# Patient Record
Sex: Male | Born: 1960 | Race: White | Hispanic: No | Marital: Single | Smoking: Current every day smoker
Health system: Southern US, Community
[De-identification: ages and names within clinical notes are randomized; demographics above are authoritative.]

## PROBLEM LIST (undated history)

## (undated) DIAGNOSIS — E7800 Pure hypercholesterolemia, unspecified: Secondary | ICD-10-CM

## (undated) DIAGNOSIS — J449 Chronic obstructive pulmonary disease, unspecified: Secondary | ICD-10-CM

## (undated) DIAGNOSIS — F200 Paranoid schizophrenia: Secondary | ICD-10-CM

## (undated) HISTORY — PX: HAND SURGERY: SHX662

## (undated) HISTORY — PX: DG BARIUM SWALLOW (ARMC HX): HXRAD1448

---

## 2011-03-31 ENCOUNTER — Emergency Department: Payer: Self-pay | Admitting: Emergency Medicine

## 2011-07-17 ENCOUNTER — Ambulatory Visit: Payer: Self-pay | Admitting: Ophthalmology

## 2011-08-21 ENCOUNTER — Ambulatory Visit: Payer: Self-pay | Admitting: Ophthalmology

## 2011-09-22 ENCOUNTER — Inpatient Hospital Stay: Payer: Self-pay | Admitting: Psychiatry

## 2011-09-26 ENCOUNTER — Emergency Department: Payer: Self-pay | Admitting: Emergency Medicine

## 2013-07-04 ENCOUNTER — Encounter (HOSPITAL_COMMUNITY): Payer: Self-pay | Admitting: Emergency Medicine

## 2013-07-04 ENCOUNTER — Emergency Department (HOSPITAL_COMMUNITY)
Admission: EM | Admit: 2013-07-04 | Discharge: 2013-07-04 | Disposition: A | Discharge status: Home / Self Care | Payer: Medicare Other | Attending: Emergency Medicine | Admitting: Emergency Medicine

## 2013-07-04 DIAGNOSIS — F200 Paranoid schizophrenia: Secondary | ICD-10-CM | POA: Insufficient documentation

## 2013-07-04 DIAGNOSIS — J4489 Other specified chronic obstructive pulmonary disease: Secondary | ICD-10-CM | POA: Insufficient documentation

## 2013-07-04 DIAGNOSIS — Z880 Allergy status to penicillin: Secondary | ICD-10-CM | POA: Insufficient documentation

## 2013-07-04 DIAGNOSIS — F17200 Nicotine dependence, unspecified, uncomplicated: Secondary | ICD-10-CM | POA: Insufficient documentation

## 2013-07-04 DIAGNOSIS — J449 Chronic obstructive pulmonary disease, unspecified: Secondary | ICD-10-CM | POA: Insufficient documentation

## 2013-07-04 HISTORY — DX: Chronic obstructive pulmonary disease, unspecified: J44.9

## 2013-07-04 HISTORY — DX: Paranoid schizophrenia: F20.0

## 2013-07-04 LAB — CBC WITH DIFFERENTIAL/PLATELET
Eosinophils Absolute: 0.6 10*3/uL (ref 0.0–0.7)
Hemoglobin: 12.8 g/dL — ABNORMAL LOW (ref 13.0–17.0)
Lymphocytes Relative: 27 % (ref 12–46)
Lymphs Abs: 2.8 10*3/uL (ref 0.7–4.0)
MCH: 29.2 pg (ref 26.0–34.0)
MCHC: 34 g/dL (ref 30.0–36.0)
Monocytes Relative: 6 % (ref 3–12)
Neutro Abs: 6.2 10*3/uL (ref 1.7–7.7)
Neutrophils Relative %: 61 % (ref 43–77)
RBC: 4.39 MIL/uL (ref 4.22–5.81)
WBC: 10.3 10*3/uL (ref 4.0–10.5)

## 2013-07-04 LAB — BASIC METABOLIC PANEL
GFR calc Af Amer: >90 mL/min (ref >90)
GFR calc non Af Amer: >90 mL/min (ref >90)
Glucose, Bld: 121 mg/dL — ABNORMAL HIGH (ref 70–99)
Potassium: 3.5 mEq/L (ref 3.5–5.1)
Sodium: 138 mEq/L (ref 135–145)

## 2013-07-04 LAB — URINALYSIS, ROUTINE W REFLEX MICROSCOPIC
Bilirubin Urine: NEGATIVE
Glucose, UA: NEGATIVE mg/dL
Hgb urine dipstick: NEGATIVE
Protein, ur: NEGATIVE mg/dL
Urobilinogen, UA: 0.2 mg/dL (ref 0.0–1.0)

## 2013-07-04 LAB — RAPID URINE DRUG SCREEN, HOSP PERFORMED
Amphetamines: NOT DETECTED
Barbiturates: NOT DETECTED
Benzodiazepines: NOT DETECTED
Cocaine: NOT DETECTED
Opiates: NOT DETECTED
Tetrahydrocannabinol: NOT DETECTED

## 2013-07-04 LAB — ETHANOL: Alcohol, Ethyl (B): <11 mg/dL (ref 0–11)

## 2013-07-04 NOTE — ED Notes (Signed)
Gave pt ice chips, telepsy set up in room, appt time 1930

## 2013-07-04 NOTE — ED Notes (Signed)
telepsy complete, they will call MD for a plan. Pt does not meet criteria for mental health in patient. [facility] is the facility the pt lives. They called for report. [phone].

## 2013-07-04 NOTE — ED Notes (Signed)
Patient arrives via EMS from [facility] care with c/o shaking. Patient states he has been abusing benadryl to get "a buzz". Patient denies SI/HI. H/o Paranoid schizophrenia.

## 2013-07-04 NOTE — BH Assessment (Signed)
Tele Assessment Note   Roberto Davis is an 52 y.o. male, single, Caucasian who presents to [facility] complaining of tremors and dissatisfaction with his current living situation. Pt has a history of schizophrenia and states he has been a resident of [facility] assisted living in [place] for approximately two years. He states that he is "terribly unhappy" living there. He states the staff are unprofessional and they do not wash their hands before serving food. He states he feels "trapped" and very lonely. He has no friends of family member who visit him and the only visitor he identifies is his attorney. He says that his unhappiness led him to abuse Benadryl and he was using 30 mg per day before he stopped on September, 2014. Pt is currently receiving outpatient medication management with Dr. Dolores Frame but is not receiving any counseling. He states he last saw Dr. Omelia Blackwater in July, has had no recent medication changes and his next appointment is in November.  Pt denies suicidal ideation or a history of suicidal gestures. He denies homicidal ideation or a history of violence. He denies any current auditory or visual hallucinations. 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 He denies any legal problems.  Pt is dressed in a hospital gown and is alert and oriented x4. He speech is somewhat difficult to understand due to not enunciating clearly. His motor behavior is restless. Mood is despondent and affect is congruent with mood. Thought process is coherent, goal directed and there is no evidence of delusional content.  Axis I: Schizophrenia by history Axis II: Deferred Axis III:  Past Medical History  Diagnosis Date  . Paranoid schizophrenia   . COPD (chronic obstructive pulmonary disease)    Axis IV: housing problems, problems related to social environment and problems with primary support group Axis V:  GAF=50  Past Medical History:  Past Medical History  Diagnosis Date  . Paranoid schizophrenia   . COPD (chronic obstructive pulmonary disease)     Past Surgical History  Procedure Laterality Date  . Hand surgery      Family History: No family history on file.  Social History:  reports that he has been smoking Cigarettes.  He has been smoking about 0.00 packs per day. He does not have any smokeless tobacco history on file. He reports that he uses illicit drugs (Marijuana). He reports that he does not drink alcohol.  Additional Social History:  Alcohol / Drug Use Pain Medications: Denies Prescriptions: Denies Over the Counter: Hx of abusing Benedryl History of alcohol / drug use?: Yes Longest period of sobriety (when/how long): NA Substance #1 Name of Substance 1: Benedryl 1 - Age of First Use: 52 1 - Amount (size/oz): 30 mg 1 - Frequency: daily 1 - Duration: Several months 1 - Last Use / Amount: 06/22/13  CIWA: CIWA-Ar BP: 138/91 mmHg Pulse Rate: 102 COWS:    Allergies:  Allergies  Allergen Reactions  . Haldol [Haloperidol Lactate] Other (See Comments)    Shaking uncontrollable   . Penicillins Other (See Comments)    Reaction unknown-childhood allergy    Home Medications:  (Not in a hospital admission)  OB/GYN Status:  No LMP for male patient.  General Assessment Data Location of Assessment: [facility] Is this a Tele or Face-to-Face Assessment?: Tele Assessment Is this an Initial Assessment or a Re-assessment for this encounter?: Initial Assessment Living Arrangements: Other (Comment) [facility]) Can  pt return to current living arrangement?: Yes Admission Status: Voluntary Is patient capable of signing voluntary admission?: Yes Transfer from: Nsg Home Referral Source: Self/Family/Friend     [facility] Crisis Care Plan Living Arrangements: Other (Comment) [facility]) Name of Psychiatrist: Dolores Frame, MD Name of Therapist:  None  Education Status Is patient currently in school?: No Current Grade: NA Highest grade of school patient has completed: NA Name of school: NA Contact person: NA  Risk to self Suicidal Ideation: No Suicidal Intent: No Is patient at risk for suicide?: No Suicidal Plan?: No-Not Currently/Within Last 6 Months Access to Means: No What has been your use of drugs/alcohol within the last 12 months?: Pt reports a history of abusing Benedryl and THC Previous Attempts/Gestures: No How many times?: 0 Other Self Harm Risks: None Triggers for Past Attempts: None known Intentional Self Injurious Behavior: None Family Suicide History: No Recent stressful life event(s): Other (Comment) (Dissatisfaction with group home staff) Persecutory voices/beliefs?: No Depression: Yes Depression Symptoms: Despondent Substance abuse history and/or treatment for substance abuse?: Yes Suicide prevention information given to non-admitted patients: Not applicable  Risk to Others Homicidal Ideation: No Thoughts of Harm to Others: No Current Homicidal Intent: No Current Homicidal Plan: No Access to Homicidal Means: No Identified Victim: None History of harm to others?: No Assessment of Violence: None Noted Violent Behavior Description: "I'm not a violent person" Does patient have access to weapons?: No Criminal Charges Pending?: No Does patient have a court date: No  Psychosis Hallucinations: None noted Delusions: None noted  Mental Status Report Appear/Hygiene: Other (Comment) (Dressed in hospital gown) Eye Contact: Good Motor Activity: Restlessness Speech: Logical/coherent Level of Consciousness: Alert Mood: Depressed Affect: Appropriate to circumstance Anxiety Level: None Thought Processes: Coherent;Relevant Judgement: Unimpaired Orientation: Person;Place;Time;Situation Obsessive Compulsive Thoughts/Behaviors: None  Cognitive Functioning Concentration: Normal Memory: Recent  Intact;Remote Intact IQ: Average Insight: Fair Impulse Control: Good Appetite: Good Weight Loss: 0 (unknown amount) Weight Gain: 0 Sleep: No Change Total Hours of Sleep: 8 Vegetative Symptoms: None  ADLScreening [facility] Assessment Services) Patient's cognitive ability adequate to safely complete daily activities?: Yes Patient able to express need for assistance with ADLs?: Yes Independently performs ADLs?: Yes (appropriate for developmental age)  Prior Inpatient Therapy Prior Inpatient Therapy: Yes Prior Therapy Dates: 2010 Prior Therapy Facilty/Provider(s): [facility] Reason for Treatment: Schizophrenia, substance abuse  Prior Outpatient Therapy Prior Outpatient Therapy: Yes Prior Therapy Dates: Current Prior Therapy Facilty/Provider(s): Dolores Frame, MD Reason for Treatment: Schizophrenia  ADL Screening (condition at time of admission) Patient's cognitive ability adequate to safely complete daily activities?: Yes Is the patient deaf or have difficulty hearing?: No Does the patient have difficulty seeing, even when wearing glasses/contacts?: No Does the patient have difficulty concentrating, remembering, or making decisions?: No Patient able to express need for assistance with ADLs?: Yes Does the patient have difficulty dressing or bathing?: No Independently performs ADLs?: Yes (appropriate for developmental age) Does the patient have difficulty walking or climbing stairs?: No Weakness of Legs: None Weakness of Arms/Hands: None  Home Assistive Devices/Equipment Home Assistive Devices/Equipment: None    Abuse/Neglect Assessment (Assessment to be complete while patient is alone) Physical Abuse: Denies Verbal Abuse: Denies Sexual Abuse: Denies Exploitation of patient/patient's resources: Denies Self-Neglect: Denies Values / Beliefs Cultural Requests During Hospitalization: None Spiritual Requests During Hospitalization: None   Advance Directives (For  Healthcare) Advance Directive: Patient does not have advance directive;Patient would not like information Pre-existing out of facility DNR order (yellow form or pink MOST form):  No Nutrition Screen- [facility] Adult/[facility] Patient's home diet: Regular  Additional Information 1:1 In Past 12 Months?: No CIRT Risk: No Elopement Risk: No Does patient have medical clearance?: Yes     Disposition:  Disposition Initial Assessment Completed for this Encounter: Yes Disposition of Patient: Outpatient treatment Type of outpatient treatment: Adult  Consulted with Dr. Donnetta Hutching who agreed Pt does not meet criteria for inpatient mental health treatment. Pt is currently outpatient with Dr. Dolores Frame [phone] and this LPC recommended Pt contact Dr. Lilia Pro office to schedule outpatient therapy. Notified Leitha Bleak, RN of disposition.  Pamalee Leyden, [facility], [facility] Triage Specialist    Davonna Belling Cheri Kearns 07/04/2013 8:28 PM

## 2013-07-04 NOTE — ED Provider Notes (Signed)
CSN: [id number]     Arrival date & time 07/04/13  1716 History  This chart was scribed for Donnetta Hutching, MD by Shari Heritage, ED Scribe. The patient was seen in room APA07/APA07. Patient's care was started at 6:19 PM.    Chief Complaint  Patient presents with  . Shaking    The history is provided by the patient. The history is limited by the condition of the patient. No language interpreter was used.   Level 5 Caveat - Unable to obtain complete history due to patient's mental status HPI Comments: Roberto Davis is a 52 y.o. male, currently residing at [facility], with history of paranoid schizophrenia who presents to the Emergency Department complaining of intermittent tremors onset 10 days ago. He reports associated impaired speech. He states that he has been abusing Benadryl to get a "buzz." Prior to quitting on 06/22/13, he was taking 30 OTC tablets per day. He also has a medical history of COPD.    Past Medical History  Diagnosis Date  . Paranoid schizophrenia   . COPD (chronic obstructive pulmonary disease)    Past Surgical History  Procedure Laterality Date  . Hand surgery     No family history on file. History  Substance Use Topics  . Smoking status: Current Every Day Smoker    Types: Cigarettes  . Smokeless tobacco: Not on file  . Alcohol Use: No    Review of Systems Level 5 Caveat - Unable to obtain full ROS due to patients mental status  Allergies  Haldol and Penicillins  Home Medications  No current outpatient prescriptions on file. Triage Vitals: BP 138/91  Pulse 102  Temp(Src) 98.8 F (37.1 C) (Oral)  Resp 25  SpO2 99% Physical Exam  Nursing note and vitals reviewed. Constitutional: He is oriented to person, place, and time. He appears well-developed and well-nourished.  HENT:  Head: Normocephalic and atraumatic.  Eyes: Conjunctivae and EOM are normal. Pupils are equal, round, and reactive to light.  Neck: Normal range of motion. Neck supple.   Cardiovascular: Normal rate, regular rhythm and normal heart sounds.   Pulmonary/Chest: Effort normal and breath sounds normal.  Abdominal: Soft. Bowel sounds are normal.  Musculoskeletal: Normal range of motion.  Neurological: He is alert and oriented to person, place, and time.  Skin: Skin is warm and dry.  Psychiatric: He has a normal mood and affect.    ED Course  Procedures (including critical care time) DIAGNOSTIC STUDIES: Oxygen Saturation is 99% on room air, normal by my interpretation.    COORDINATION OF CARE: 6:23 PM- Will order Telepsych Consult. Patient informed of current plan for treatment and evaluation and agrees with plan at this time.     Labs Review Labs Reviewed  BASIC METABOLIC PANEL - Abnormal; Notable for the following:    Glucose, Bld 121 (*)    BUN 4 (*)    All other components within normal limits  CBC WITH DIFFERENTIAL - Abnormal; Notable for the following:    Hemoglobin 12.8 (*)    HCT 37.6 (*)    Eosinophils Relative 6 (*)    All other components within normal limits  URINALYSIS, ROUTINE W REFLEX MICROSCOPIC - Abnormal; Notable for the following:    Specific Gravity, Urine <1.005 (*)    All other components within normal limits  ETHANOL  URINE RAPID DRUG SCREEN (HOSP PERFORMED)   Imaging Review No results found.  MDM  No diagnosis found. Patient has a history of paranoid schizophrenia. Screen  medical exam shows no reason to keep him in the hospital.   Patient has outpatient therapy follow up  I personally performed the services described in this documentation, which was scribed in my presence. The recorded information has been reviewed and is accurate.    Donnetta Hutching, MD 07/04/13 2027

## 2013-07-04 NOTE — ED Notes (Signed)
[facility], advised pt is being discharged.  they will come and pick up pt.

## 2013-07-04 NOTE — ED Notes (Signed)
Patient given discharge instruction, verbalized understand. Patient ambulatory out of the department to waiting area for ride.  

## 2013-07-04 NOTE — ED Notes (Signed)
Pt hungry, feeding pt.

## 2014-03-20 ENCOUNTER — Emergency Department: Payer: Self-pay | Admitting: Emergency Medicine

## 2014-03-21 LAB — COMPREHENSIVE METABOLIC PANEL
ANION GAP: 7 (ref 7–16)
Albumin: 3.8 g/dL (ref 3.4–5.0)
Alkaline Phosphatase: 38 U/L — ABNORMAL LOW
BUN: 6 mg/dL — ABNORMAL LOW (ref 7–18)
Bilirubin,Total: 0.2 mg/dL (ref 0.2–1.0)
CHLORIDE: 106 mmol/L (ref 98–107)
CREATININE: 1.34 mg/dL — AB (ref 0.60–1.30)
Calcium, Total: 8.8 mg/dL (ref 8.5–10.1)
Co2: 23 mmol/L (ref 21–32)
EGFR (Non-African Amer.): >60
GLUCOSE: 120 mg/dL — AB (ref 65–99)
OSMOLALITY: 271 (ref 275–301)
Potassium: 3.1 mmol/L — ABNORMAL LOW (ref 3.5–5.1)
SGOT(AST): 21 U/L (ref 15–37)
SGPT (ALT): 24 U/L (ref 12–78)
SODIUM: 136 mmol/L (ref 136–145)
TOTAL PROTEIN: 6.2 g/dL — AB (ref 6.4–8.2)

## 2014-03-21 LAB — ETHANOL

## 2014-03-21 LAB — URINALYSIS, COMPLETE
BLOOD: NEGATIVE
Bacteria: NONE SEEN
Bilirubin,UR: NEGATIVE
GLUCOSE, UR: NEGATIVE mg/dL (ref 0–75)
LEUKOCYTE ESTERASE: NEGATIVE
NITRITE: NEGATIVE
PH: 7 (ref 4.5–8.0)
PROTEIN: NEGATIVE
RBC,UR: <1 /HPF (ref 0–5)
Specific Gravity: 1.004 (ref 1.003–1.030)
Squamous Epithelial: NONE SEEN

## 2014-03-21 LAB — DRUG SCREEN, URINE
AMPHETAMINES, UR SCREEN: NEGATIVE (ref ?–1000)
Barbiturates, Ur Screen: NEGATIVE (ref ?–200)
Benzodiazepine, Ur Scrn: NEGATIVE (ref ?–200)
Cannabinoid 50 Ng, Ur ~~LOC~~: NEGATIVE (ref ?–50)
Cocaine Metabolite,Ur ~~LOC~~: NEGATIVE (ref ?–300)
MDMA (ECSTASY) UR SCREEN: NEGATIVE (ref ?–500)
Methadone, Ur Screen: NEGATIVE (ref ?–300)
OPIATE, UR SCREEN: NEGATIVE (ref ?–300)
PHENCYCLIDINE (PCP) UR S: NEGATIVE (ref ?–25)
Tricyclic, Ur Screen: NEGATIVE (ref ?–1000)

## 2014-03-21 LAB — ACETAMINOPHEN LEVEL: Acetaminophen: <2 ug/mL

## 2014-03-21 LAB — CBC
HCT: 37.5 % — ABNORMAL LOW (ref 40.0–52.0)
HGB: 12.3 g/dL — ABNORMAL LOW (ref 13.0–18.0)
MCH: 30.2 pg (ref 26.0–34.0)
MCHC: 32.8 g/dL (ref 32.0–36.0)
MCV: 92 fL (ref 80–100)
Platelet: 229 10*3/uL (ref 150–440)
RBC: 4.07 10*6/uL — ABNORMAL LOW (ref 4.40–5.90)
RDW: 13.1 % (ref 11.5–14.5)
WBC: 10.1 10*3/uL (ref 3.8–10.6)

## 2014-03-21 LAB — LIPASE, BLOOD: Lipase: 277 U/L (ref 73–393)

## 2014-03-21 LAB — TROPONIN I

## 2014-03-21 LAB — MAGNESIUM: MAGNESIUM: 1.6 mg/dL — AB

## 2014-03-21 LAB — SALICYLATE LEVEL: Salicylates, Serum: 2.9 mg/dL — ABNORMAL HIGH

## 2014-03-22 LAB — OSMOLALITY: Osmolality: 302 mOsm/kg — ABNORMAL HIGH (ref 275–295)

## 2014-05-27 ENCOUNTER — Emergency Department: Payer: Self-pay | Admitting: Emergency Medicine

## 2014-05-27 LAB — CBC
HCT: 37.4 % — AB (ref 40.0–52.0)
HGB: 12.7 g/dL — ABNORMAL LOW (ref 13.0–18.0)
MCH: 31.7 pg (ref 26.0–34.0)
MCHC: 33.9 g/dL (ref 32.0–36.0)
MCV: 93 fL (ref 80–100)
Platelet: 235 10*3/uL (ref 150–440)
RBC: 4 10*6/uL — AB (ref 4.40–5.90)
RDW: 12.8 % (ref 11.5–14.5)
WBC: 12.2 10*3/uL — ABNORMAL HIGH (ref 3.8–10.6)

## 2014-05-27 LAB — DRUG SCREEN, URINE
AMPHETAMINES, UR SCREEN: NEGATIVE (ref ?–1000)
BARBITURATES, UR SCREEN: NEGATIVE (ref ?–200)
Benzodiazepine, Ur Scrn: NEGATIVE (ref ?–200)
Cannabinoid 50 Ng, Ur ~~LOC~~: NEGATIVE (ref ?–50)
Cocaine Metabolite,Ur ~~LOC~~: NEGATIVE (ref ?–300)
MDMA (ECSTASY) UR SCREEN: NEGATIVE (ref ?–500)
METHADONE, UR SCREEN: NEGATIVE (ref ?–300)
OPIATE, UR SCREEN: NEGATIVE (ref ?–300)
PHENCYCLIDINE (PCP) UR S: NEGATIVE (ref ?–25)
TRICYCLIC, UR SCREEN: NEGATIVE (ref ?–1000)

## 2014-05-27 LAB — COMPREHENSIVE METABOLIC PANEL
ALBUMIN: 3.8 g/dL (ref 3.4–5.0)
ALK PHOS: 40 U/L — AB
ALT: 29 U/L
ANION GAP: 8 (ref 7–16)
AST: 32 U/L (ref 15–37)
BILIRUBIN TOTAL: 0.2 mg/dL (ref 0.2–1.0)
BUN: 10 mg/dL (ref 7–18)
CALCIUM: 8.4 mg/dL — AB (ref 8.5–10.1)
CHLORIDE: 108 mmol/L — AB (ref 98–107)
CO2: 23 mmol/L (ref 21–32)
Creatinine: 1.15 mg/dL (ref 0.60–1.30)
EGFR (African American): >60
EGFR (Non-African Amer.): >60
GLUCOSE: 122 mg/dL — AB (ref 65–99)
OSMOLALITY: 278 (ref 275–301)
POTASSIUM: 3.6 mmol/L (ref 3.5–5.1)
SODIUM: 139 mmol/L (ref 136–145)
Total Protein: 6.8 g/dL (ref 6.4–8.2)

## 2014-05-27 LAB — URINALYSIS, COMPLETE
BACTERIA: NONE SEEN
BLOOD: NEGATIVE
Bilirubin,UR: NEGATIVE
Glucose,UR: NEGATIVE mg/dL (ref 0–75)
Ketone: NEGATIVE
LEUKOCYTE ESTERASE: NEGATIVE
Nitrite: NEGATIVE
PH: 7 (ref 4.5–8.0)
Protein: NEGATIVE
RBC,UR: 1 /HPF (ref 0–5)
Specific Gravity: 1.003 (ref 1.003–1.030)
Squamous Epithelial: NONE SEEN
WBC UR: NONE SEEN /HPF (ref 0–5)

## 2014-05-27 LAB — ACETAMINOPHEN LEVEL: Acetaminophen: <2 ug/mL

## 2014-05-27 LAB — ETHANOL: Ethanol %: <0.003 % (ref 0.000–0.080)

## 2014-05-27 LAB — SALICYLATE LEVEL: SALICYLATES, SERUM: 2.7 mg/dL
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
## 2015-02-05 NOTE — Consult Note (Signed)
PATIENT NAME:  Roberto Davis, Roberto Davis MR#:  [id number] DATE OF BIRTH:  [birth year]  DATE OF CONSULTATION:  03/21/2014  REFERRING PHYSICIAN:   CONSULTING PHYSICIAN:  Machaela Caterino K. Gildo Crisco, MD  HISTORY:  The patient was seen in [facility] Emergency Room.  The patient is a 54 year old white male, not employed and is on disability for mental illness for many years.  The patient has been residing at current group home for 2-1/2 years.  The patient was brought to the Emergency Room after the patient drank isopropyl alcohol.  Has extensive history of psychosis and mood disorder with prior history of multiple inpatient hospitalizations.  The patient reports that he was nervous and anxious and so he drank a little bit of isopropyl rubbing alcohol and he realizes he should not have done it.    ALCOHOL AND DRUGS:  Denies any history of drug abuse.     CHIEF COMPLAINT:  "I want to go home.  Can I go home?  I was nervous, I want to go home.  I will not do any such thing again."  PAST PSYCHIATRIC HISTORY:  History of inpatient psychiatry on many occasions.  Long history of mental illness.    MENTAL STATUS:  The patient is very anxious and nervous, repeatedly stating that he wanted to go home and be with other members of the group home.  Alert and oriented.  He knew it was June 2015.  He knew that he was in [facility].  Denied feeling depressed, denied feeling hopeless or helpless.  Denies any auditory or visual hallucinations.  Admits that he is anxious and nervous and all he needs is coffee to drink.  Denies any ideas or plans to hurt himself or others, contracts for safety.  Insight and judgment guarded.  Impulse control is fair.  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  RECOMMENDATIONS:  Discharge the patient back to his group home as he contracts for safety.    ____________________________ Wallace Cullens. Franchot Mimes, MD skc:cs D: 03/21/2014 18:04:24  ET T: 03/21/2014 20:27:56 ET JOB#: [id number]  cc: Arlyn Leak K. Franchot Mimes, MD, <Dictator> Dewain Penning MD ELECTRONICALLY SIGNED 03/22/2014 7:03

## 2015-02-06 NOTE — H&P (Signed)
PATIENT NAME:  Roberto Davis, Roberto MR#:  [id number] DATE OF BIRTH:  1961/03/26  DATE OF ADMISSION:  09/22/2011  REFERRING PHYSICIAN: Dr. Lenise Arena   ATTENDING PHYSICIAN: Dr. Orson Slick    IDENTIFYING DATA: Mr. Roberto Davis is a 54 year old male with history of schizoaffective disorder.   CHIEF COMPLAINT: "I overdosed".   HISTORY OF PRESENT ILLNESS: Mr. Roberto Davis has a long history of psychosis and mood instability. He was brought from his family care home after he disclosed to staff members that he overdosed on over 30 pills of Benadryl. He was confused and had slurred speech. The patient denies that it was a suicide attempt. He likes the buzz that he is getting from Benadryl. He abuses it regularly, last time a month ago after his father passed away. He did it at the funeral. He was abusing Benadryl also at his previous facility in [place] where he stayed for over three years. He has been at his current location for six months. He has an ongoing conflict with the home Freight forwarder. 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 This is not allowed. He denies any symptoms of depression, anxiety, or psychosis as long as he is maintained on his medications, however, he was overheard by staff talking to himself. When asked directly, he reported that did have hallucinations a while ago and this is the voice of a young girl. He believes that it is somehow related to his old girlfriend who passed away. This is a nonthreatening voice.   PAST PSYCHIATRIC HISTORY: There were multiple, multiple hospitalizations at [facility]. The patient is nonspecific. There were multiple medication trials. He is currently in the care of Dr. Rosine Door who prescribes Prolixin and trazodone throughout the day to help anger management. The patient believes that having trazodone and Prolixin made enormous difference. Had it not  been for Benadryl abuse, he probably would be very stable. He reports one suicide attempt but does not provide any specific information. Again, he does not consider Benadryl overdoses suicide attempts as he does it for fun.   FAMILY PSYCHIATRIC HISTORY: None reported.   PAST MEDICAL HISTORY:  1. Coronary artery disease.  2. Gastroesophageal reflux disease.  3. Dyslipidemia.  4. Arthritis.   MEDICATIONS ON ADMISSION:  1. Prolixin 5 mg twice daily and 10 mg at bedtime. 2. Aspirin 81 mg daily.  3. Omeprazole 20 mg daily.  4. Trazodone 100 mg at bedtime. 5. Cogentin 1 mg daily.  6. Lipitor 40 mg daily.  7. Thorazine 50 mg twice daily as needed. The patient claims he is allergic. 8. Ibuprofen 400 mg 3 times daily as needed. 9. Ventolin 2 puffs every four hours as needed. 10. Trazodone 50 mg every six hours as needed.   ALLERGIES: Benadryl, Haldol, Lamictal, penicillin.   SOCIAL: He has been a resident of many assisted living facilities and family care homes for many years now. There is no family.   REVIEW OF SYSTEMS: CONSTITUTIONAL: No fevers or chills. No weight changes. EYES: No double or blurred vision. ENT: No hearing loss. RESPIRATORY: No shortness of breath or cough. CARDIOVASCULAR: No chest pain or orthopnea. GASTROINTESTINAL: No abdominal pain, nausea, vomiting, or diarrhea. GU: No incontinence or frequency. ENDOCRINE: No heat or cold intolerance. LYMPHATIC: No anemia or easy bruising. INTEGUMENTARY: No acne or rash. MUSCULOSKELETAL: Positive for joint pain. NEUROLOGIC: No tingling or weakness. PSYCHIATRIC: See  history of present illness for details.   PHYSICAL EXAMINATION:   VITAL SIGNS: Blood pressure 143/95, pulse 82, respirations 20, temperature 97.5.   GENERAL: This is a well developed male in no acute distress.   HEENT: The pupils are equal, round, and reactive to light. Sclerae anicteric.   NECK: Supple. No thyromegaly.   LUNGS: Clear to auscultation. No dullness to  percussion.   HEART: Regular rhythm and rate. No murmurs, rubs, or gallops.   ABDOMEN: Soft, nontender, nondistended.   MUSCULOSKELETAL: Normal muscle strength in all extremities.   SKIN: No rashes or bruises.   LYMPHATIC: No cervical adenopathy.   NEUROLOGIC: Cranial nerves II through XII are intact.   LABORATORY, DIAGNOSTIC, AND RADIOLOGICAL DATA: Chemistries are within normal limits except for blood glucose of 103. Blood alcohol level 0. LFTs within normal limits. TSH 1.56. Urine tox screen positive for tricyclic antidepressants, they are not on his list, and MDMA. CBC within normal limits with white blood count 9.9, hemoglobin 12.8, platelets 240. Urinalysis is not suggestive of urinary tract infection. Serum acetaminophen less than 2. Serum salicylates 5.8.   MENTAL STATUS EXAMINATION ON ADMISSION: The patient is alert and oriented to person, place, time, and situation. He is pleasant, polite, and cooperative. He is well groomed and casually dressed. He maintains good eye contact. Her speech is soft. Mood is fine with full affect. Thought processing is logical and goal oriented. Thought content he denies thoughts of hurting himself or others but was admitted after a suicide attempt by Benadryl overdose. He admits to mild auditory hallucination hearing a voice of a young child. His cognition is grossly intact. He registers 3 out of 3 and recalls 2 out of 3 objects after five minutes. He can spell world forward and backward. He can do serial sevens. His abstraction is preserved. His insight and judgment are questionable.   SUICIDE RISK ASSESSMENT ON ADMISSION: This is a patient with a long history of severe mental illness with psychosis and mood instability who abuses Benadryl for recreational purposes.    DIAGNOSES:  AXIS I: Schizoaffective disorder, bipolar type.   AXIS II: Deferred.   AXIS III:  1. Status post Benadryl overdose. 2. Hypertension.  3. Dyslipidemia.  4. Chronic  obstructive pulmonary disease.  5. Gastroesophageal reflux disease.   AXIS IV: Mental illness, substance abuse, primary support, need to find another placement.   AXIS V: GAF on admission 25.   PLAN: The patient was admitted to [facility] for safety, stabilization, and medication management. He was initially placed on suicide precautions and was closely monitored for any unsafe behaviors. He underwent full psychiatric and risk assessment. He received pharmacotherapy, individual and group psychotherapy, substance abuse counseling, and support from therapeutic milieu.  1. Suicidality. The patient adamantly denies and is able to contract for safety.  2. Mood. We will continue Prolixin 5 mg 3 times daily along with trazodone 50 mg 3 times daily. We may need to adjust the dose of antipsychotic if the patient still complains of hallucinations.  3. Medical. We will continue all medications as prescribed in the community.  4. Elevated blood pressure. He may need an addition of antihypertensives. Will monitor.  5. Disposition. To be established. I am not certain if the patient is welcome back in his facility. We may need to find him a new home.   ____________________________ Wardell Honour. Bary Leriche, MD [id number] D: 09/23/2011 15:37:00 ET T: 09/23/2011 16:07:23 ET JOB#: [id number]  cc: Kassidy Frankson B.  Bary Leriche, MD, <Dictator> Clovis Fredrickson MD ELECTRONICALLY SIGNED 10/24/2011 23:33

## 2016-07-18 ENCOUNTER — Emergency Department (HOSPITAL_COMMUNITY)
Admission: EM | Admit: 2016-07-18 | Discharge: 2016-07-18 | Disposition: A | Discharge status: Home / Self Care | Payer: Medicare Other | Attending: Emergency Medicine | Admitting: Emergency Medicine

## 2016-07-18 ENCOUNTER — Emergency Department (HOSPITAL_COMMUNITY): Payer: Medicare Other

## 2016-07-18 ENCOUNTER — Encounter (HOSPITAL_COMMUNITY): Payer: Self-pay | Admitting: Emergency Medicine

## 2016-07-18 DIAGNOSIS — Z79899 Other long term (current) drug therapy: Secondary | ICD-10-CM | POA: Diagnosis not present

## 2016-07-18 DIAGNOSIS — R0789 Other chest pain: Secondary | ICD-10-CM | POA: Insufficient documentation

## 2016-07-18 DIAGNOSIS — F17210 Nicotine dependence, cigarettes, uncomplicated: Secondary | ICD-10-CM | POA: Diagnosis not present

## 2016-07-18 DIAGNOSIS — Z7982 Long term (current) use of aspirin: Secondary | ICD-10-CM | POA: Diagnosis not present

## 2016-07-18 DIAGNOSIS — R079 Chest pain, unspecified: Secondary | ICD-10-CM | POA: Diagnosis present

## 2016-07-18 DIAGNOSIS — J449 Chronic obstructive pulmonary disease, unspecified: Secondary | ICD-10-CM | POA: Insufficient documentation

## 2016-07-18 HISTORY — DX: Pure hypercholesterolemia, unspecified: E78.00

## 2016-07-18 LAB — BASIC METABOLIC PANEL
Anion gap: 5 (ref 5–15)
BUN: 5 mg/dL — AB (ref 6–20)
CALCIUM: 9.4 mg/dL (ref 8.9–10.3)
CHLORIDE: 103 mmol/L (ref 101–111)
CO2: 26 mmol/L (ref 22–32)
CREATININE: 0.99 mg/dL (ref 0.61–1.24)
GFR calc Af Amer: >60 mL/min (ref >60)
Glucose, Bld: 91 mg/dL (ref 65–99)
Potassium: 4.1 mmol/L (ref 3.5–5.1)
SODIUM: 134 mmol/L — AB (ref 135–145)

## 2016-07-18 LAB — HEPATIC FUNCTION PANEL
ALK PHOS: 34 U/L — AB (ref 38–126)
ALT: 14 U/L — AB (ref 17–63)
AST: 16 U/L (ref 15–41)
Albumin: 4.1 g/dL (ref 3.5–5.0)
BILIRUBIN DIRECT: 0.1 mg/dL (ref 0.1–0.5)
BILIRUBIN INDIRECT: 0.5 mg/dL (ref 0.3–0.9)
TOTAL PROTEIN: 6.1 g/dL — AB (ref 6.5–8.1)
Total Bilirubin: 0.6 mg/dL (ref 0.3–1.2)

## 2016-07-18 LAB — CBC
HCT: 38.4 % — ABNORMAL LOW (ref 39.0–52.0)
Hemoglobin: 12.8 g/dL — ABNORMAL LOW (ref 13.0–17.0)
MCH: 30.7 pg (ref 26.0–34.0)
MCHC: 33.3 g/dL (ref 30.0–36.0)
MCV: 92.1 fL (ref 78.0–100.0)
Platelets: 344 10*3/uL (ref 150–400)
RBC: 4.17 MIL/uL — ABNORMAL LOW (ref 4.22–5.81)
RDW: 12.8 % (ref 11.5–15.5)
WBC: 10.8 10*3/uL — AB (ref 4.0–10.5)

## 2016-07-18 LAB — TROPONIN I

## 2016-07-18 MED ORDER — TRAMADOL HCL 50 MG PO TABS: 50.0000 mg | ORAL_TABLET | Freq: Four times a day (QID) | ORAL | 0 refills | Status: AC | PRN

## 2016-07-18 NOTE — ED Provider Notes (Signed)
[facility] DEPT Provider Note   CSN: [id number] Arrival date & time: 07/18/16  1438     History   Chief Complaint Chief Complaint  Patient presents with  . Chest Pain    HPI Roberto Davis is a 55 y.o. male.  Patient complains of left-sided chest pain when he moves or touches his chest.    Chest Pain   This is a new problem. The current episode started more than 2 days ago. The problem occurs constantly. The problem has not changed since onset.The pain is associated with movement. Pain location: Left anterior chest. The pain is at a severity of 4/10. The pain is mild. The quality of the pain is described as brief. The pain does not radiate. Exacerbated by: Palpation and movement. Pertinent negatives include no abdominal pain, no back pain, no cough and no headaches.  Pertinent negatives for past medical history include no seizures.    Past Medical History:  Diagnosis Date  . COPD (chronic obstructive pulmonary disease) ([place])   . High cholesterol   . Paranoid schizophrenia ([place])     There are no active problems to display for this patient.   Past Surgical History:  Procedure Laterality Date  . HAND SURGERY         Home Medications    Prior to Admission medications   Medication Sig Start Date End Date Taking? Authorizing Provider  aspirin EC 81 MG tablet Take 81 mg by mouth daily.   Yes Historical Provider, MD  benztropine (COGENTIN) 1 MG tablet Take 1 mg by mouth at bedtime as needed.  05/25/13  Yes Historical Provider, MD  chlorproMAZINE (THORAZINE) 200 MG tablet Take 200 mg by mouth at bedtime.   Yes Historical Provider, MD  chlorproMAZINE (THORAZINE) 50 MG tablet Take 100 mg by mouth 2 (two) times daily.   Yes Historical Provider, MD  fluPHENAZine (PROLIXIN) 5 MG tablet Take 5-10 mg by mouth See admin instructions. Takes one tablet twice daily and two at bedtime 06/23/13  Yes Historical Provider, MD  ibuprofen (ADVIL,MOTRIN) 400 MG tablet Take 400 mg by  mouth 3 (three) times daily as needed for pain or fever (for headache).   Yes Historical Provider, MD  LORazepam (ATIVAN) 2 MG tablet Take 2 mg by mouth at bedtime.   Yes Historical Provider, MD  pantoprazole (PROTONIX) 40 MG tablet Take 40 mg by mouth daily.   Yes Historical Provider, MD  QUEtiapine (SEROQUEL) 100 MG tablet Take 200 mg by mouth at bedtime.   Yes Historical Provider, MD  rosuvastatin (CRESTOR) 20 MG tablet Take 20 mg by mouth at bedtime.   Yes Historical Provider, MD  traZODone (DESYREL) 50 MG tablet Take 100 mg by mouth at bedtime.  06/23/13  Yes Historical Provider, MD  triamcinolone cream (KENALOG) 0.1 % Apply 1 application topically 3 (three) times daily as needed (for healing).   Yes Historical Provider, MD  TRILIPIX 135 MG capsule Take 135 mg by mouth daily.  06/23/13  Yes Historical Provider, MD  VENTOLIN HFA 108 (90 BASE) MCG/ACT inhaler Inhale 1 puff into the lungs every 6 (six) hours as needed.  05/21/13  Yes Historical Provider, MD  ZETIA 10 MG tablet Take 10 mg by mouth daily.  06/23/13  Yes Historical Provider, MD  traMADol (ULTRAM) 50 MG tablet Take 1 tablet (50 mg total) by mouth every 6 (six) hours as needed. 07/18/16   Milton Ferguson, MD    Family History No family history on file.  Social History  Social History  Substance Use Topics  . Smoking status: Current Every Day Smoker    Types: Cigarettes  . Smokeless tobacco: Never Used  . Alcohol use No     Allergies   Haldol [haloperidol lactate] and Penicillins   Review of Systems Review of Systems  Constitutional: Negative for appetite change and fatigue.  HENT: Negative for congestion, ear discharge and sinus pressure.   Eyes: Negative for discharge.  Respiratory: Negative for cough.   Cardiovascular: Positive for chest pain.  Gastrointestinal: Negative for abdominal pain and diarrhea.  Genitourinary: Negative for frequency and hematuria.  Musculoskeletal: Negative for back pain.  Skin: Negative for rash.   Neurological: Negative for seizures and headaches.  Psychiatric/Behavioral: Negative for hallucinations.     Physical Exam Updated Vital Signs BP (!) 121/109 (BP Location: Right Arm)   Pulse 78   Temp 98.2 F (36.8 C) (Oral)   Resp 18   Ht 5\' 11"  (1.803 m)   Wt 145 lb 5 oz (65.9 kg)   SpO2 100%   BMI 20.27 kg/m   Physical Exam  Constitutional: He is oriented to person, place, and time. He appears well-developed.  HENT:  Head: Normocephalic.  Eyes: Conjunctivae and EOM are normal. No scleral icterus.  Neck: Neck supple. No thyromegaly present.  Cardiovascular: Normal rate and regular rhythm.  Exam reveals no gallop and no friction rub.   No murmur heard. Pulmonary/Chest: No stridor. He has no wheezes. He has no rales. He exhibits tenderness.  Abdominal: He exhibits no distension. There is no tenderness. There is no rebound.  Musculoskeletal: Normal range of motion. He exhibits no edema.  Lymphadenopathy:    He has no cervical adenopathy.  Neurological: He is oriented to person, place, and time. He exhibits normal muscle tone. Coordination normal.  Skin: No rash noted. No erythema.  Psychiatric: He has a normal mood and affect. His behavior is normal.     ED Treatments / Results  Labs (all labs ordered are listed, but only abnormal results are displayed) Labs Reviewed  BASIC METABOLIC PANEL - Abnormal; Notable for the following:       Result Value   Sodium 134 (*)    BUN 5 (*)    All other components within normal limits  CBC - Abnormal; Notable for the following:    WBC 10.8 (*)    RBC 4.17 (*)    Hemoglobin 12.8 (*)    HCT 38.4 (*)    All other components within normal limits  HEPATIC FUNCTION PANEL - Abnormal; Notable for the following:    Total Protein 6.1 (*)    ALT 14 (*)    Alkaline Phosphatase 34 (*)    All other components within normal limits  TROPONIN I    EKG  EKG Interpretation  Date/Time:  Wednesday July 18 2016 14:46:09 EDT Ventricular  Rate:  90 PR Interval:  164 QRS Duration: 140 QT Interval:  384 QTC Calculation: 469 R Axis:   102 Text Interpretation:  Normal sinus rhythm Right atrial enlargement Right bundle branch block Cannot rule out Inferior infarct , age undetermined Abnormal ECG Confirmed by Duane Earnshaw  MD, Broadus John [phone]) on 07/18/2016 3:28:41 PM       Radiology Dg Chest 2 View  Result Date: 07/18/2016 CLINICAL DATA:  Left anterior chest pain for several days, smoking history EXAM: CHEST  2 VIEW COMPARISON:  Portable chest x-ray of 03/21/2014 FINDINGS: Compared to the prior chest x-ray, there does appear to be slight blunting of the  left costophrenic angle and a small left pleural effusion versus pleural thickening would be the primary considerations. The right lung is clear. Mediastinal and hilar contours are unremarkable. The heart is within normal limits in size. No bony abnormality is seen. IMPRESSION: No pneumonia.  Cannot exclude tiny left pleural effusion. Electronically Signed   By: Ivar Drape M.D.   On: 07/18/2016 16:04    Procedures Procedures (including critical care time)  Medications Ordered in ED Medications - No data to display   Initial Impression / Assessment and Plan / ED Course  I have reviewed the triage vital signs and the nursing notes.  Pertinent labs & imaging results that were available during my care of the patient were reviewed by me and considered in my medical decision making (see chart for details).  Clinical Course   Labs unremarkable EKG shows no acute changes. Suspect chest wall pain. Patient given prescription for Ultram will follow-up with PCP  Final Clinical Impressions(s) / ED Diagnoses   Final diagnoses:  Chest wall pain    New Prescriptions New Prescriptions   TRAMADOL (ULTRAM) 50 MG TABLET    Take 1 tablet (50 mg total) by mouth every 6 (six) hours as needed.     Milton Ferguson, MD 07/18/16 [phone]

## 2016-07-18 NOTE — ED Notes (Signed)
Lab at the bedside 

## 2016-07-18 NOTE — ED Triage Notes (Signed)
Pt c/o left sided cp x 1 week. Pt reports history of GERD and states he has been using a bowflex weight lifting machine recently.

## 2016-07-18 NOTE — ED Notes (Signed)
Patient given discharge instruction, verbalized understand. Patient ambulatory out of the department.  

## 2016-07-18 NOTE — Discharge Instructions (Signed)
Follow up with your md next week for recheck °

## 2016-09-05 ENCOUNTER — Telehealth: Payer: Self-pay | Admitting: Gastroenterology

## 2016-09-05 NOTE — Telephone Encounter (Signed)
Spoke to [place] to schedule an appointment with GI for Nausea/vomiting. She was in a meeting and said she would call me back to schedule. Referred by [facility]

## 2016-09-26 ENCOUNTER — Ambulatory Visit (INDEPENDENT_AMBULATORY_CARE_PROVIDER_SITE_OTHER): Payer: Medicare Other | Admitting: Gastroenterology

## 2016-09-26 ENCOUNTER — Other Ambulatory Visit
Admission: RE | Admit: 2016-09-26 | Discharge: 2016-09-26 | Disposition: A | Discharge status: Home / Self Care | Payer: Medicare Other | Source: Ambulatory Visit | Attending: Gastroenterology | Admitting: Gastroenterology

## 2016-09-26 ENCOUNTER — Other Ambulatory Visit: Payer: Self-pay

## 2016-09-26 VITALS — BP 115/71 | HR 69 | Temp 98.5°F | Ht 71.0 in | Wt 115.0 lb

## 2016-09-26 DIAGNOSIS — R634 Abnormal weight loss: Secondary | ICD-10-CM | POA: Diagnosis not present

## 2016-09-26 DIAGNOSIS — F200 Paranoid schizophrenia: Secondary | ICD-10-CM | POA: Insufficient documentation

## 2016-09-26 DIAGNOSIS — E7800 Pure hypercholesterolemia, unspecified: Secondary | ICD-10-CM | POA: Insufficient documentation

## 2016-09-26 DIAGNOSIS — R112 Nausea with vomiting, unspecified: Secondary | ICD-10-CM

## 2016-09-26 DIAGNOSIS — J449 Chronic obstructive pulmonary disease, unspecified: Secondary | ICD-10-CM | POA: Insufficient documentation

## 2016-09-26 LAB — CBC WITH DIFFERENTIAL/PLATELET
Basophils Absolute: 0.1 10*3/uL (ref 0–0.1)
Basophils Relative: 0 %
Eosinophils Absolute: 0.1 10*3/uL (ref 0–0.7)
Eosinophils Relative: 1 %
HEMATOCRIT: 42.2 % (ref 40.0–52.0)
HEMOGLOBIN: 14.3 g/dL (ref 13.0–18.0)
LYMPHS ABS: 2.3 10*3/uL (ref 1.0–3.6)
LYMPHS PCT: 18 %
MCH: 30.9 pg (ref 26.0–34.0)
MCHC: 33.9 g/dL (ref 32.0–36.0)
MCV: 91.1 fL (ref 80.0–100.0)
Monocytes Absolute: 0.6 10*3/uL (ref 0.2–1.0)
Monocytes Relative: 5 %
NEUTROS PCT: 76 %
Neutro Abs: 10 10*3/uL — ABNORMAL HIGH (ref 1.4–6.5)
Platelets: 292 10*3/uL (ref 150–440)
RBC: 4.63 MIL/uL (ref 4.40–5.90)
RDW: 13.8 % (ref 11.5–14.5)
WBC: 13 10*3/uL — AB (ref 3.8–10.6)

## 2016-09-26 LAB — BASIC METABOLIC PANEL
Anion gap: 7 (ref 5–15)
BUN: 14 mg/dL (ref 6–20)
CHLORIDE: 99 mmol/L — AB (ref 101–111)
CO2: 30 mmol/L (ref 22–32)
Calcium: 9.5 mg/dL (ref 8.9–10.3)
Creatinine, Ser: 0.9 mg/dL (ref 0.61–1.24)
GFR calc Af Amer: >60 mL/min (ref >60)
GFR calc non Af Amer: >60 mL/min (ref >60)
GLUCOSE: 91 mg/dL (ref 65–99)
POTASSIUM: 3 mmol/L — AB (ref 3.5–5.1)
Sodium: 136 mmol/L (ref 135–145)

## 2016-09-26 LAB — HEPATIC FUNCTION PANEL
ALK PHOS: 50 U/L (ref 38–126)
ALT: 22 U/L (ref 17–63)
AST: 26 U/L (ref 15–41)
Albumin: 4.1 g/dL (ref 3.5–5.0)
BILIRUBIN DIRECT: 0.2 mg/dL (ref 0.1–0.5)
BILIRUBIN INDIRECT: 0.4 mg/dL (ref 0.3–0.9)
TOTAL PROTEIN: 6.6 g/dL (ref 6.5–8.1)
Total Bilirubin: 0.6 mg/dL (ref 0.3–1.2)

## 2016-09-26 LAB — TSH: TSH: 0.653 u[IU]/mL (ref 0.350–4.500)

## 2016-09-26 NOTE — Progress Notes (Signed)
Gastroenterology Consultation  Referring Provider:     No ref. provider found Primary Care Physician:  No PCP Per Patient Primary Gastroenterologist:  Dr. Jonathon Bellows  Reason for Consultation:     Nausea and vomiting         HPI:   Roberto Davis is a 55 y.o. y/o male referred for consultation & management  By [facility] practice for nausea and vomiting.   He says that he had nausea and vomiting last summer and it stopped , weeks back and none since . He is accompanied by his nurse. She states that he has lost 30 lbs since October. His nurse states that , he is here for the same reason.   Denies any abdominal pains, no hematemesis, he has a bowel movement daily , soft and no blood. Last colonoscopy many years back . No EGD. Apetite per nurse was not good but getting better. He says he started losing weight last summer.   His nurse states has had a carotid doppler, commenced on zofran few weeks back . During the visit the patient kept insisting that he is doing well and does not require any further testing . He does have an attorney who is his POA. The nurse accompanying him will discuss options of care with the POA .   Past Medical History:  Diagnosis Date  . COPD (chronic obstructive pulmonary disease) ([place])   . High cholesterol   . Paranoid schizophrenia [facility])     Past Surgical History:  Procedure Laterality Date  . HAND SURGERY      Prior to Admission medications   Medication Sig Start Date End Date Taking? Authorizing Provider  aspirin EC 81 MG tablet Take 81 mg by mouth daily.    Historical Provider, MD  benztropine (COGENTIN) 1 MG tablet Take 1 mg by mouth at bedtime as needed.  05/25/13   Historical Provider, MD  chlorproMAZINE (THORAZINE) 200 MG tablet Take 200 mg by mouth at bedtime.    Historical Provider, MD  chlorproMAZINE (THORAZINE) 50 MG tablet Take 100 mg by mouth 2 (two) times daily.    Historical Provider, MD  fluPHENAZine (PROLIXIN) 5 MG tablet Take  5-10 mg by mouth See admin instructions. Takes one tablet twice daily and two at bedtime 06/23/13   Historical Provider, MD  ibuprofen (ADVIL,MOTRIN) 400 MG tablet Take 400 mg by mouth 3 (three) times daily as needed for pain or fever (for headache).    Historical Provider, MD  LORazepam (ATIVAN) 2 MG tablet Take 2 mg by mouth at bedtime.    Historical Provider, MD  pantoprazole (PROTONIX) 40 MG tablet Take 40 mg by mouth daily.    Historical Provider, MD  QUEtiapine (SEROQUEL) 100 MG tablet Take 200 mg by mouth at bedtime.    Historical Provider, MD  rosuvastatin (CRESTOR) 20 MG tablet Take 20 mg by mouth at bedtime.    Historical Provider, MD  traMADol (ULTRAM) 50 MG tablet Take 1 tablet (50 mg total) by mouth every 6 (six) hours as needed. 07/18/16   Milton Ferguson, MD  traZODone (DESYREL) 50 MG tablet Take 100 mg by mouth at bedtime.  06/23/13   Historical Provider, MD  triamcinolone cream (KENALOG) 0.1 % Apply 1 application topically 3 (three) times daily as needed (for healing).    Historical Provider, MD  TRILIPIX 135 MG capsule Take 135 mg by mouth daily.  06/23/13   Historical Provider, MD  VENTOLIN HFA 108 (90 BASE) MCG/ACT inhaler Inhale  1 puff into the lungs every 6 (six) hours as needed.  05/21/13   Historical Provider, MD  ZETIA 10 MG tablet Take 10 mg by mouth daily.  06/23/13   Historical Provider, MD    No family history on file.   Social History  Substance Use Topics  . Smoking status: Current Every Day Smoker    Types: Cigarettes  . Smokeless tobacco: Never Used  . Alcohol use No    Allergies as of 09/26/2016 - Review Complete 07/18/2016  Allergen Reaction Noted  . Haldol [haloperidol lactate] Other (See Comments) 07/04/2013  . Penicillins Other (See Comments) 07/04/2013    Review of Systems:    All systems reviewed and negative except where noted in HPI.   Physical Exam:  There were no vitals taken for this visit. No LMP for male patient. Psych:  Alert and cooperative.  Anxious. General:   Alert,  Well-developed, well-nourished, pleasant and cooperative in NAD Head:  Normocephalic and atraumatic. Eyes:  Sclera clear, no icterus.   Conjunctiva pink. Ears:  Normal auditory acuity. Nose:  No deformity, discharge, or lesions. Mouth:  No deformity or lesions,oropharynx pink & moist. Neck:  Supple; no masses or thyromegaly. Lungs:  Respirations even and unlabored.  Clear throughout to auscultation.   No wheezes, crackles, or rhonchi. No acute distress. Heart:  Regular rate and rhythm; no murmurs, clicks, rubs, or gallops. Abdomen:  Loss of abdominal pad of fat, Normal bowel sounds.  No bruits.  Soft, non-tender and non-distended without masses, hepatosplenomegaly or hernias noted.  No guarding or rebound tenderness.    Msk:  Symmetrical without gross deformities. Good, equal movement & strength bilaterally. No cervical lymphadenopathy .  Imaging Studies:  No results found.   BP 115/71   Pulse 69   Temp 98.5 F (36.9 C) (Oral)   Ht 5\' 11"  (1.803 m)   Wt 115 lb (52.2 kg)   BMI 16.04 kg/m    Assessment and Plan:   Roberto Davis is a 55 y.o. y/o male has been referred for nausea and vomiting . This has resolved but per our own records has lost 30 lbs weight . On examination he is very thin and cachectic and am concerned of underlying neoplasia. I will order lab work , get CT chest/abdomen and pelvis , EGD. He adamantly refused a colonoscopy but I will bring him back in 2 weeks to try again.   I have discussed alternative options, risks & benefits,  which include, but are not limited to, bleeding, infection, perforation,respiratory complication & drug reaction.  The patient agrees with this plan & written consent will be obtained.     Follow up in 2 weeks   Dr Jonathon Bellows MD

## 2016-09-29 LAB — CELIAC DISEASE PANEL
ENDOMYSIAL ANTIBODY IGA: NEGATIVE
IgA: 160 mg/dL (ref 90–386)
Tissue Transglutaminase Ab, IgA: <2 U/mL (ref 0–3)

## 2016-10-01 ENCOUNTER — Other Ambulatory Visit: Payer: Self-pay

## 2016-10-01 DIAGNOSIS — R634 Abnormal weight loss: Secondary | ICD-10-CM

## 2016-10-02 ENCOUNTER — Ambulatory Visit
Admission: RE | Admit: 2016-10-02 | Discharge: 2016-10-02 | Disposition: A | Discharge status: Home / Self Care | Payer: Medicare Other | Source: Ambulatory Visit | Attending: Gastroenterology | Admitting: Gastroenterology

## 2016-10-02 DIAGNOSIS — I700 Atherosclerosis of aorta: Secondary | ICD-10-CM | POA: Diagnosis not present

## 2016-10-02 DIAGNOSIS — R634 Abnormal weight loss: Secondary | ICD-10-CM | POA: Insufficient documentation

## 2016-10-02 DIAGNOSIS — K449 Diaphragmatic hernia without obstruction or gangrene: Secondary | ICD-10-CM | POA: Diagnosis not present

## 2016-10-02 MED ORDER — IOPAMIDOL (ISOVUE-300) INJECTION 61%
100.0000 mL | Freq: Once | INTRAVENOUS | Status: AC | PRN
  Administered 2016-10-02: 100 mL via INTRAVENOUS

## 2016-10-03 ENCOUNTER — Other Ambulatory Visit: Payer: Self-pay

## 2016-10-03 ENCOUNTER — Telehealth: Payer: Self-pay

## 2016-10-03 DIAGNOSIS — R634 Abnormal weight loss: Secondary | ICD-10-CM

## 2016-10-03 NOTE — Telephone Encounter (Signed)
Caretaker, Mariann Laster informed of lab and CT scan results. She will contact pt's POA and inform him of these results as well. Order placed for her to bring pt to the lab and repeat CMP. Will discuss further at next ov on 10/16/16 these results as well as discuss scheduling a Colonoscopy and EGD.

## 2016-10-03 NOTE — Telephone Encounter (Signed)
-----   Message from Jonathon Bellows, MD sent at 10/02/2016 12:38 PM EST ----- Moderate hiatal hernia otherwise normal

## 2016-10-03 NOTE — Telephone Encounter (Signed)
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  Potassium low- suggest to drink more fruit juices, oranges, bananas and recheck bmp in 2-3 days

## 2016-10-11 ENCOUNTER — Encounter: Payer: Self-pay | Admitting: Emergency Medicine

## 2016-10-11 ENCOUNTER — Emergency Department
Admission: EM | Admit: 2016-10-11 | Discharge: 2016-10-11 | Disposition: A | Discharge status: Home / Self Care | Payer: Medicare Other | Attending: Emergency Medicine | Admitting: Emergency Medicine

## 2016-10-11 ENCOUNTER — Telehealth: Payer: Self-pay

## 2016-10-11 DIAGNOSIS — R112 Nausea with vomiting, unspecified: Secondary | ICD-10-CM | POA: Insufficient documentation

## 2016-10-11 DIAGNOSIS — R110 Nausea: Secondary | ICD-10-CM

## 2016-10-11 DIAGNOSIS — R634 Abnormal weight loss: Secondary | ICD-10-CM

## 2016-10-11 DIAGNOSIS — J449 Chronic obstructive pulmonary disease, unspecified: Secondary | ICD-10-CM | POA: Insufficient documentation

## 2016-10-11 DIAGNOSIS — Z79899 Other long term (current) drug therapy: Secondary | ICD-10-CM | POA: Insufficient documentation

## 2016-10-11 DIAGNOSIS — F17210 Nicotine dependence, cigarettes, uncomplicated: Secondary | ICD-10-CM | POA: Insufficient documentation

## 2016-10-11 LAB — COMPREHENSIVE METABOLIC PANEL
ALT: 15 U/L — AB (ref 17–63)
AST: 17 U/L (ref 15–41)
Albumin: 4 g/dL (ref 3.5–5.0)
Alkaline Phosphatase: 52 U/L (ref 38–126)
Anion gap: 8 (ref 5–15)
BUN: 16 mg/dL (ref 6–20)
CHLORIDE: 100 mmol/L — AB (ref 101–111)
CO2: 29 mmol/L (ref 22–32)
CREATININE: 1.04 mg/dL (ref 0.61–1.24)
Calcium: 9.5 mg/dL (ref 8.9–10.3)
GFR calc Af Amer: >60 mL/min (ref >60)
GLUCOSE: 95 mg/dL (ref 65–99)
POTASSIUM: 3.3 mmol/L — AB (ref 3.5–5.1)
Sodium: 137 mmol/L (ref 135–145)
Total Bilirubin: 0.5 mg/dL (ref 0.3–1.2)
Total Protein: 6.4 g/dL — ABNORMAL LOW (ref 6.5–8.1)

## 2016-10-11 LAB — CBC
HCT: 46 % (ref 40.0–52.0)
HEMOGLOBIN: 15.5 g/dL (ref 13.0–18.0)
MCH: 30.7 pg (ref 26.0–34.0)
MCHC: 33.6 g/dL (ref 32.0–36.0)
MCV: 91.3 fL (ref 80.0–100.0)
PLATELETS: 346 10*3/uL (ref 150–440)
RBC: 5.04 MIL/uL (ref 4.40–5.90)
RDW: 13.1 % (ref 11.5–14.5)
WBC: 13.4 10*3/uL — AB (ref 3.8–10.6)

## 2016-10-11 LAB — LIPASE, BLOOD: LIPASE: 37 U/L (ref 11–51)

## 2016-10-11 MED ORDER — ONDANSETRON 4 MG PO TBDP: 8.0000 mg | ORAL_TABLET | Freq: Once | ORAL | Status: DC

## 2016-10-11 NOTE — Telephone Encounter (Signed)
LVM for Mariann Laster (caretaker) to return my call.

## 2016-10-11 NOTE — ED Provider Notes (Signed)
[facility] Emergency Department Provider Note  Time seen: 5:58 PM  I have reviewed the triage vital signs and the nursing notes.   HISTORY  Chief Complaint Emesis    HPI Roberto Davis is a 55 y.o. male with a past medical history of paranoid schizophrenia, hyperlipidemia, COPD presents the emergency department for medical evaluation. According to the patient's caregiver she states the patient has lost approximately 30 pounds over the past 4 months. Occasional episodes of vomiting, denies any diarrhea, denies any chest or abdominal pain. Denies any fever. They've seen a GI doctor who recommends an endoscopy which has not been performed yet. Caregiver states the patient is refusing most of his home medications. Initially refusing care in the emergency department however is now agreeable to lab work. Patient is not currently nauseated. He states it comes and goes, the caregiver agrees with this.    Past Medical History:  Diagnosis Date  . COPD (chronic obstructive pulmonary disease) ([place])   . High cholesterol   . Paranoid schizophrenia [facility])     Patient Active Problem List   Diagnosis Date Noted  . Paranoid schizophrenia ([place])   . High cholesterol   . COPD (chronic obstructive pulmonary disease) ([place])     Past Surgical History:  Procedure Laterality Date  . HAND SURGERY      Prior to Admission medications   Medication Sig Start Date End Date Taking? Authorizing Provider  aspirin EC 81 MG tablet Take 81 mg by mouth daily.    Historical Provider, MD  benztropine (COGENTIN) 1 MG tablet Take 1 mg by mouth at bedtime as needed.  05/25/13   Historical Provider, MD  chlorproMAZINE (THORAZINE) 200 MG tablet Take 200 mg by mouth at bedtime.    Historical Provider, MD  chlorproMAZINE (THORAZINE) 50 MG tablet Take 100 mg by mouth 2 (two) times daily.    Historical Provider, MD  fluPHENAZine (PROLIXIN) 5 MG tablet Take 5-10 mg by mouth See admin instructions.  Takes one tablet twice daily and two at bedtime 06/23/13   Historical Provider, MD  ibuprofen (ADVIL,MOTRIN) 400 MG tablet Take 400 mg by mouth 3 (three) times daily as needed for pain or fever (for headache).    Historical Provider, MD  LORazepam (ATIVAN) 2 MG tablet Take 2 mg by mouth at bedtime.    Historical Provider, MD  pantoprazole (PROTONIX) 40 MG tablet Take 40 mg by mouth daily.    Historical Provider, MD  QUEtiapine (SEROQUEL) 100 MG tablet Take 200 mg by mouth at bedtime.    Historical Provider, MD  rosuvastatin (CRESTOR) 20 MG tablet Take 20 mg by mouth at bedtime.    Historical Provider, MD  traMADol (ULTRAM) 50 MG tablet Take 1 tablet (50 mg total) by mouth every 6 (six) hours as needed. 07/18/16   Milton Ferguson, MD  traZODone (DESYREL) 50 MG tablet Take 100 mg by mouth at bedtime.  06/23/13   Historical Provider, MD  triamcinolone cream (KENALOG) 0.1 % Apply 1 application topically 3 (three) times daily as needed (for healing).    Historical Provider, MD  TRILIPIX 135 MG capsule Take 135 mg by mouth daily.  06/23/13   Historical Provider, MD  VENTOLIN HFA 108 (90 BASE) MCG/ACT inhaler Inhale 1 puff into the lungs every 6 (six) hours as needed.  05/21/13   Historical Provider, MD  ZETIA 10 MG tablet Take 10 mg by mouth daily.  06/23/13   Historical Provider, MD    Allergies  Allergen  Reactions  . Haldol [Haloperidol Lactate] Other (See Comments)    Shaking uncontrollable   . Penicillins Other (See Comments)    Reaction unknown-childhood allergy    No family history on file.  Social History Social History  Substance Use Topics  . Smoking status: Current Every Day Smoker    Types: Cigarettes  . Smokeless tobacco: Never Used  . Alcohol use No    Review of Systems Constitutional: Negative for fever. Cardiovascular: Negative for chest pain. Respiratory: Negative for shortness of breath. Gastrointestinal: Negative for abdominal pain. Occasional nausea and vomiting. None currently.   Genitourinary: Negative for dysuria. Neurological: Negative for headache 10-point ROS otherwise negative.  ____________________________________________   PHYSICAL EXAM:  VITAL SIGNS: ED Triage Vitals  Enc Vitals Group     BP 10/11/16 1621 (!) 141/108     Pulse Rate 10/11/16 1621 61     Resp 10/11/16 1621 18     Temp 10/11/16 1621 98.1 F (36.7 C)     Temp Source 10/11/16 1621 Oral     SpO2 10/11/16 1621 100 %     Weight 10/11/16 1621 115 lb (52.2 kg)     Height --      Head Circumference --      Peak Flow --      Pain Score 10/11/16 1742 0     Pain Loc --      Pain Edu? --      Excl. in [place]? --     Constitutional: Alert and oriented. Well appearing and in no distress. Eyes: Normal exam ENT   Head: Normocephalic and atraumatic.   Mouth/Throat: Mucous membranes are moist. Cardiovascular: Normal rate, regular rhythm. No murmur Respiratory: Normal respiratory effort without tachypnea nor retractions. Breath sounds are clear  Gastrointestinal: Soft and nontender. No distention.   Musculoskeletal: Nontender with normal range of motion in all extremities.  Neurologic:  Normal speech and language. No gross focal neurologic deficits Skin:  Skin is warm, dry and intact.  Psychiatric: Mood and affect are normal.   ____________________________________________    INITIAL IMPRESSION / ASSESSMENT AND PLAN / ED COURSE  Pertinent labs & imaging results that were available during my care of the patient were reviewed by me and considered in my medical decision making (see chart for details).  Patient presents the emergency department for significant weight loss and intermittent nausea and vomiting occurring over the past 3-4 months area and no apparent acute issue. We will check labs. We will closely monitor in the emergency department. Care worker states the patient eats very very little each day, states he is not hungry.   Patient's labs are largely within normal limits.  Patient is asymptomatic currently. He has a GI medicine appointment per caregiver. We will discharge home. ____________________________________________   FINAL CLINICAL IMPRESSION(S) / ED DIAGNOSES  Intermittent nausea and vomiting Weight loss    Harvest Dark, MD 10/11/16 2106

## 2016-10-11 NOTE — ED Notes (Signed)
Pt paranoid and refusing vitals and refusing to give urine sample at this time

## 2016-10-11 NOTE — ED Notes (Addendum)
Pt care giver reports that he has lost weight (lost 40lbs over the last 3 months) - he has had CT of abd (moderate hiatal hernia) - he is refusing all medication except Ativan at night - caregiver states that he is not eating - K+ low but pt refuses supplement - caregiver reports pt has been vomiting for 3 months (pt reports he is not vomiting) - pt is schizophrenic and reluctant to be treated

## 2016-10-11 NOTE — ED Notes (Signed)
Called lab to check on lab results - they stated that CBC has resulted and that they are checking on the other results

## 2016-10-11 NOTE — ED Triage Notes (Addendum)
Pt to ed with caregiver from assisted living facility with reports of vomiting for over a mth and has lost  Over 10 lbs. aslo has been refusing to take his meds per [facility] from facility.  Pt states he is fine and does not want to see a doctor or have his blood work taken. Pt states he is very active and has lost some weight but states he feels fine.

## 2016-10-11 NOTE — ED Notes (Signed)
Pt agitated about having to wait on lab results - refusing to give urine sample at this time

## 2016-10-11 NOTE — ED Notes (Signed)
Green top recollected from pt and lab called to let them know I was sending sample down and that provider wanted the lab ran as soon as possible

## 2016-10-11 NOTE — Telephone Encounter (Signed)
Roberto Davis -- Calling from the Care home, she supervises them. Contact [phone].   Patient was seen on 09/26/2016, he was 115 lbs. He is down to 105 lbs having the same symptoms. He is also a psych patient and she is having him admitted into the hospital. He needs medical treatment. He does not have a guardian, but is in a care home. Please call [facility] ASAP. Patient has had a CT scan but not an EGD

## 2016-10-12 NOTE — Telephone Encounter (Signed)
Spoke with Mariann Laster (caretaker) this morning and she stated pt was not admitted to the hospital yesterday. He was 115lbs at this last office visit with Dr. Vicente Males and now he is 105lb. Per Mariann Laster, pt is not taking his psych meds and will not eat. Pt is scheduled for an appt with Dr. Vicente Males on Tuesday. Will discuss setting pt up for an EGD at that appt.

## 2016-10-16 ENCOUNTER — Encounter: Payer: Self-pay | Admitting: Gastroenterology

## 2016-10-16 ENCOUNTER — Ambulatory Visit (INDEPENDENT_AMBULATORY_CARE_PROVIDER_SITE_OTHER): Payer: Medicare Other | Admitting: Gastroenterology

## 2016-10-16 VITALS — BP 102/70 | HR 90 | Resp 18 | Ht 71.0 in | Wt 104.6 lb

## 2016-10-16 DIAGNOSIS — R634 Abnormal weight loss: Secondary | ICD-10-CM

## 2016-10-16 NOTE — Progress Notes (Signed)
Primary Care Physician: No PCP Per Patient  Primary Gastroenterologist:  Dr. Jonathon Bellows   Chief Complaint  Patient presents with  . Follow-up    Nausea and Vomiting    HPI: Roberto Davis is a 56 y.o. male .He is here to follow up to his last visit on 09/26/16 for nausea,vomiting , unintentional weight loss.   Interval history 09/2016-10/2016  CT abdomen - aortic atherosclerosis , moderate sized hiatal hernia.  Chest CT was denied by insurance and we ordered an xray which has not been done as yet.  Hb 15.5, LFT-normal. Celiac serology-negative. TSH,Lipase are normal.   Lost 11 lbs since last visit .  He says he has stopped taking a lot of his pills . Recently started on Olanzipine. Apetite has improved since. He was taken to the Er for "spitting". Denies any nausea or vomiting.   Denies any GI issues presently   Current Outpatient Prescriptions  Medication Sig Dispense Refill  . LORazepam (ATIVAN) 2 MG tablet Take 2 mg by mouth at bedtime.    Marland Kitchen OLANZapine zydis (ZYPREXA) 10 MG disintegrating tablet Take 10 mg by mouth at bedtime.    Marland Kitchen aspirin EC 81 MG tablet Take 81 mg by mouth daily.    . benztropine (COGENTIN) 1 MG tablet Take 1 mg by mouth at bedtime as needed.     . chlorproMAZINE (THORAZINE) 200 MG tablet Take 200 mg by mouth at bedtime.    . chlorproMAZINE (THORAZINE) 50 MG tablet Take 100 mg by mouth 2 (two) times daily.    . fluPHENAZine (PROLIXIN) 5 MG tablet Take 5-10 mg by mouth See admin instructions. Takes one tablet twice daily and two at bedtime    . ibuprofen (ADVIL,MOTRIN) 400 MG tablet Take 400 mg by mouth 3 (three) times daily as needed for pain or fever (for headache).    . pantoprazole (PROTONIX) 40 MG tablet Take 40 mg by mouth daily.    . QUEtiapine (SEROQUEL) 100 MG tablet Take 200 mg by mouth at bedtime.    . rosuvastatin (CRESTOR) 20 MG tablet Take 20 mg by mouth at bedtime.    . traMADol (ULTRAM) 50 MG tablet Take 1 tablet (50 mg total) by mouth  every 6 (six) hours as needed. (Patient not taking: Reported on 10/16/2016) 15 tablet 0  . traZODone (DESYREL) 50 MG tablet Take 100 mg by mouth at bedtime.     . triamcinolone cream (KENALOG) 0.1 % Apply 1 application topically 3 (three) times daily as needed (for healing).    . TRILIPIX 135 MG capsule Take 135 mg by mouth daily.     . VENTOLIN HFA 108 (90 BASE) MCG/ACT inhaler Inhale 1 puff into the lungs every 6 (six) hours as needed.     Marland Kitchen ZETIA 10 MG tablet Take 10 mg by mouth daily.      No current facility-administered medications for this visit.     Allergies as of 10/16/2016 - Review Complete 10/16/2016  Allergen Reaction Noted  . Haldol [haloperidol lactate] Other (See Comments) 07/04/2013  . Penicillins Other (See Comments) 07/04/2013    ROS:  General: Negative for anorexia, weight loss, fever, chills, fatigue, weakness. ENT: Negative for hoarseness, difficulty swallowing , nasal congestion. CV: Negative for chest pain, angina, palpitations, dyspnea on exertion, peripheral edema.  Respiratory: Negative for dyspnea at rest, dyspnea on exertion, cough, sputum, wheezing.  GI: See history of present illness. GU:  Negative for dysuria, hematuria, urinary incontinence, urinary frequency, nocturnal urination.  Endo: Negative for unusual weight change.    Physical Examination:   Ht 5\' 11"  (1.803 m)   Wt 104 lb 9.6 oz (47.4 kg)   BMI 14.59 kg/m   General: very thin and cachectic  Eyes: No icterus. Conjunctivae pink. Mouth: Oropharyngeal mucosa moist and pink , no lesions erythema or exudate. Lungs: Clear to auscultation bilaterally. Non-labored. Heart: Regular rate and rhythm, no murmurs rubs or gallops.  Abdomen: Bowel sounds are normal, nontender, nondistended, no hepatosplenomegaly or masses, no abdominal bruits or hernia , no rebound or guarding.   Extremities: No lower extremity edema. No clubbing or deformities. Neuro: Alert and oriented x 3.  Grossly intact. Skin: Warm  and dry, no jaundice.   Psych: Alert and cooperative, normal mood and affect.    Imaging Studies: Ct Abdomen Pelvis W Contrast  Result Date: 10/02/2016 CLINICAL DATA:  Generalized abdominal pain, unintentional weight loss. EXAM: CT ABDOMEN AND PELVIS WITH CONTRAST TECHNIQUE: Multidetector CT imaging of the abdomen and pelvis was performed using the standard protocol following bolus administration of intravenous contrast. CONTRAST:  155mL ISOVUE-300 IOPAMIDOL (ISOVUE-300) INJECTION 61% COMPARISON:  None. FINDINGS: Lower chest: No acute abnormality. Hepatobiliary: No focal liver abnormality is seen. No gallstones, gallbladder wall thickening, or biliary dilatation. Pancreas: Unremarkable. No pancreatic ductal dilatation or surrounding inflammatory changes. Spleen: Normal in size without focal abnormality. Adrenals/Urinary Tract: Adrenal glands are unremarkable. Kidneys are normal, without renal calculi, focal lesion, or hydronephrosis. Bladder is unremarkable. Stomach/Bowel: There is no evidence of bowel obstruction. The visualized portion of the appendix appears normal. Vascular/Lymphatic: Aortic atherosclerosis. No enlarged abdominal or pelvic lymph nodes. Reproductive: Prostate is unremarkable. Other: Moderate size hiatal hernia is noted. No abnormal fluid collection is noted. Musculoskeletal: No acute or significant osseous findings. IMPRESSION: Aortic atherosclerosis. Moderate size hiatal hernia. No other definite abnormality seen in the abdomen or pelvis. Electronically Signed   By: Marijo Conception, M.D.   On: 10/02/2016 11:00    Assessment and Plan:   Roberto Davis is a 56 y.o. y/o male initially referred for nausea/vomiting that had resolved. There was concern for unintentional weight loss. Since last visit has lost 11 lbs/.  It is very likely that his weight loss is due to poor nutrition intake secondary to underlying schizophrenia. From the GI point of view I suggested an EGD+ colonoscopy  to ensure no underlying neoplasm. He follows with [facility]. He absolutely refuses a colonoscopy .    Dr Jonathon Bellows  MD Follow up in 8 weeks.

## 2016-10-16 NOTE — Addendum Note (Signed)
Addended by: Phillips Odor on: 10/16/2016 04:58 PM   Modules accepted: Orders, SmartSet

## 2016-10-16 NOTE — Patient Instructions (Signed)
We have scheduled your EGD on 10/25/16. Please see your attached paperwork for details.

## 2016-10-24 ENCOUNTER — Encounter: Payer: Self-pay | Admitting: *Deleted

## 2016-10-25 ENCOUNTER — Ambulatory Visit: Payer: Medicare Other | Admitting: Anesthesiology

## 2016-10-25 ENCOUNTER — Encounter
Admission: RE | Disposition: A | Discharge status: Home / Self Care | Payer: Self-pay | Source: Ambulatory Visit | Attending: Gastroenterology

## 2016-10-25 ENCOUNTER — Encounter: Payer: Self-pay | Admitting: *Deleted

## 2016-10-25 ENCOUNTER — Ambulatory Visit
Admission: RE | Admit: 2016-10-25 | Discharge: 2016-10-25 | Disposition: A | Discharge status: Home / Self Care | Payer: Medicare Other | Source: Ambulatory Visit | Attending: Gastroenterology | Admitting: Gastroenterology

## 2016-10-25 DIAGNOSIS — Z880 Allergy status to penicillin: Secondary | ICD-10-CM | POA: Diagnosis not present

## 2016-10-25 DIAGNOSIS — Z79899 Other long term (current) drug therapy: Secondary | ICD-10-CM | POA: Diagnosis not present

## 2016-10-25 DIAGNOSIS — Z7982 Long term (current) use of aspirin: Secondary | ICD-10-CM | POA: Diagnosis not present

## 2016-10-25 DIAGNOSIS — Z791 Long term (current) use of non-steroidal anti-inflammatories (NSAID): Secondary | ICD-10-CM | POA: Insufficient documentation

## 2016-10-25 DIAGNOSIS — Z9889 Other specified postprocedural states: Secondary | ICD-10-CM | POA: Diagnosis not present

## 2016-10-25 DIAGNOSIS — F17210 Nicotine dependence, cigarettes, uncomplicated: Secondary | ICD-10-CM | POA: Diagnosis not present

## 2016-10-25 DIAGNOSIS — J449 Chronic obstructive pulmonary disease, unspecified: Secondary | ICD-10-CM | POA: Diagnosis not present

## 2016-10-25 DIAGNOSIS — R634 Abnormal weight loss: Secondary | ICD-10-CM | POA: Insufficient documentation

## 2016-10-25 DIAGNOSIS — E7800 Pure hypercholesterolemia, unspecified: Secondary | ICD-10-CM | POA: Insufficient documentation

## 2016-10-25 DIAGNOSIS — Z888 Allergy status to other drugs, medicaments and biological substances status: Secondary | ICD-10-CM | POA: Diagnosis not present

## 2016-10-25 DIAGNOSIS — F200 Paranoid schizophrenia: Secondary | ICD-10-CM | POA: Insufficient documentation

## 2016-10-25 HISTORY — PX: ESOPHAGOGASTRODUODENOSCOPY (EGD) WITH PROPOFOL: SHX5813

## 2016-10-25 SURGERY — ESOPHAGOGASTRODUODENOSCOPY (EGD) WITH PROPOFOL
Anesthesia: General

## 2016-10-25 MED ORDER — PROPOFOL 500 MG/50ML IV EMUL
INTRAVENOUS | Status: AC
Start: 2016-10-25 — End: 2016-10-25
  Filled 2016-10-25: qty 50

## 2016-10-25 MED ORDER — EPHEDRINE 5 MG/ML INJ
INTRAVENOUS | Status: AC
  Filled 2016-10-25: qty 10

## 2016-10-25 MED ORDER — LIDOCAINE 2% (20 MG/ML) 5 ML SYRINGE
INTRAMUSCULAR | Status: DC | PRN
  Administered 2016-10-25: 30 mg via INTRAVENOUS

## 2016-10-25 MED ORDER — FENTANYL CITRATE (PF) 100 MCG/2ML IJ SOLN
INTRAMUSCULAR | Status: AC
Start: 2016-10-25 — End: 2016-10-25
  Filled 2016-10-25: qty 2

## 2016-10-25 MED ORDER — PROPOFOL 500 MG/50ML IV EMUL
INTRAVENOUS | Status: DC | PRN
  Administered 2016-10-25: 100 ug/kg/min via INTRAVENOUS

## 2016-10-25 MED ORDER — SODIUM CHLORIDE 0.9 % IV SOLN
INTRAVENOUS | Status: DC
  Administered 2016-10-25 (×2): via INTRAVENOUS

## 2016-10-25 MED ORDER — EPHEDRINE SULFATE 50 MG/ML IJ SOLN
INTRAMUSCULAR | Status: DC | PRN
  Administered 2016-10-25 (×2): 10 mg via INTRAVENOUS

## 2016-10-25 MED ORDER — MIDAZOLAM HCL 2 MG/2ML IJ SOLN
INTRAMUSCULAR | Status: AC
  Filled 2016-10-25: qty 2

## 2016-10-25 MED ORDER — FENTANYL CITRATE (PF) 100 MCG/2ML IJ SOLN
INTRAMUSCULAR | Status: DC | PRN
  Administered 2016-10-25: 50 ug via INTRAVENOUS

## 2016-10-25 MED ORDER — PROPOFOL 10 MG/ML IV BOLUS
INTRAVENOUS | Status: DC | PRN
  Administered 2016-10-25: 80 mg via INTRAVENOUS

## 2016-10-25 NOTE — H&P (Signed)
Jonathon Bellows MD [address] [place], [place] [address] Phone: [phone] Fax : [phone]  Primary Care Physician:  No PCP Per Patient Primary Gastroenterologist:  Dr. Jonathon Bellows   Pre-Procedure History & Physical: HPI:  Roberto Davis is a 56 y.o. male is here for an endoscopy.   Past Medical History:  Diagnosis Date  . COPD (chronic obstructive pulmonary disease) ([place])   . High cholesterol   . Paranoid schizophrenia [facility])     Past Surgical History:  Procedure Laterality Date  . HAND SURGERY      Prior to Admission medications   Medication Sig Start Date End Date Taking? Authorizing Provider  aspirin EC 81 MG tablet Take 81 mg by mouth daily.   Yes Historical Provider, MD  benztropine (COGENTIN) 1 MG tablet Take 1 mg by mouth at bedtime as needed.  05/25/13  Yes Historical Provider, MD  chlorproMAZINE (THORAZINE) 200 MG tablet Take 200 mg by mouth at bedtime.   Yes Historical Provider, MD  chlorproMAZINE (THORAZINE) 50 MG tablet Take 100 mg by mouth 2 (two) times daily.   Yes Historical Provider, MD  fluPHENAZine (PROLIXIN) 5 MG tablet Take 5-10 mg by mouth See admin instructions. Takes one tablet twice daily and two at bedtime 06/23/13  Yes Historical Provider, MD  ibuprofen (ADVIL,MOTRIN) 400 MG tablet Take 400 mg by mouth 3 (three) times daily as needed for pain or fever (for headache).   Yes Historical Provider, MD  OLANZapine zydis (ZYPREXA) 10 MG disintegrating tablet Take 10 mg by mouth at bedtime.   Yes Historical Provider, MD  pantoprazole (PROTONIX) 40 MG tablet Take 40 mg by mouth daily.   Yes Historical Provider, MD  QUEtiapine (SEROQUEL) 100 MG tablet Take 200 mg by mouth at bedtime.   Yes Historical Provider, MD  rosuvastatin (CRESTOR) 20 MG tablet Take 20 mg by mouth at bedtime.   Yes Historical Provider, MD  traMADol (ULTRAM) 50 MG tablet Take 1 tablet (50 mg total) by mouth every 6 (six) hours as needed. 07/18/16  Yes Milton Ferguson, MD  traZODone  (DESYREL) 50 MG tablet Take 100 mg by mouth at bedtime.  06/23/13  Yes Historical Provider, MD  triamcinolone cream (KENALOG) 0.1 % Apply 1 application topically 3 (three) times daily as needed (for healing).   Yes Historical Provider, MD  TRILIPIX 135 MG capsule Take 135 mg by mouth daily.  06/23/13  Yes Historical Provider, MD  VENTOLIN HFA 108 (90 BASE) MCG/ACT inhaler Inhale 1 puff into the lungs every 6 (six) hours as needed.  05/21/13  Yes Historical Provider, MD  ZETIA 10 MG tablet Take 10 mg by mouth daily.  06/23/13  Yes Historical Provider, MD  LORazepam (ATIVAN) 2 MG tablet Take 2 mg by mouth at bedtime.    Historical Provider, MD    Allergies as of 10/16/2016 - Review Complete 10/16/2016  Allergen Reaction Noted  . Haldol [haloperidol lactate] Other (See Comments) 07/04/2013  . Penicillins Other (See Comments) 07/04/2013    History reviewed. No pertinent family history.  Social History   Social History  . Marital status: Single    Spouse name: N/A  . Number of children: N/A  . Years of education: N/A   Occupational History  . Not on file.   Social History Main Topics  . Smoking status: Current Every Day Smoker    Types: Cigarettes  . Smokeless tobacco: Never Used  . Alcohol use No  . Drug use:     Types:  Marijuana  . Sexual activity: Not on file   Other Topics Concern  . Not on file   Social History Narrative  . No narrative on file    Review of Systems: See HPI, otherwise negative ROS  Physical Exam: BP (!) 86/67   Pulse 62   Temp 97.1 F (36.2 C) (Tympanic)   Resp 13   Wt 93 lb (42.2 kg)   SpO2 95%   BMI 12.97 kg/m  General:   Alert,  pleasant and cooperative in NAD Head:  Normocephalic and atraumatic. Neck:  Supple; no masses or thyromegaly. Lungs:  Clear throughout to auscultation.    Heart:  Regular rate and rhythm. Abdomen:  Soft, nontender and nondistended. Normal bowel sounds, without guarding, and without rebound.   Neurologic:  Alert and   oriented x4;  grossly normal neurologically.  Impression/Plan: Roberto Davis is here for an endoscopy to be performed for weight loss  Risks, benefits, limitations, and alternatives regarding  endoscopy have been reviewed with the patient.  Questions have been answered.  All parties agreeable.   Jonathon Bellows, MD  10/25/2016, 11:54 AM

## 2016-10-25 NOTE — Op Note (Signed)
[facility]astroenter[facility] Patient Name: Roberto Davis Procedure Date: 10/25/2016 10:54 AM MRN: [id number] Account #: [id number] Date of Birth: 18-Feb-1961 Admit Type: Outpatient Age: 56 Room: [facility] ENDO ROOM 4 Gender: Male Note Status: Finalized Procedure:            Upper GI endoscopy Indications:          Weight loss Providers:            Jonathon Bellows MD, MD Referring MD:         Jordan Likes. Lavena Bullion (Referring MD) Complications:        No immediate complications. Procedure:            Pre-Anesthesia Assessment:                       - Prior to the procedure, a History and Physical was                        performed, and patient medications, allergies and                        sensitivities were reviewed. The patient's tolerance of                        previous anesthesia was reviewed.                       - The risks and benefits of the procedure and the                        sedation options and risks were discussed with the                        patient. All questions were answered and informed                        consent was obtained.                       - The risks and benefits of the procedure and the                        sedation options and risks were discussed with the                        patient. All questions were answered and informed                        consent was obtained.                       - ASA Grade Assessment: III - A patient with severe                        systemic disease.                       After obtaining informed consent, the endoscope was                        passed under direct vision. Throughout the procedure,  the patient's blood pressure, pulse, and oxygen                        saturations were monitored continuously. The Endoscope                        was introduced through the mouth, with the intention of                        advancing to the esophagus. The scope was  advanced to                        the middle third of the esophagus before the procedure                        was aborted. Medications were given. The upper GI                        endoscopy was accomplished with ease. The patient                        tolerated the procedure well. The procedure was aborted                        due to presence of food. Findings:      Food was found in the middle third of the esophagus. Large qty of thick       liquid and food content seen coming up from the stomach into the       esophagus. Felt high risk to proceed due to possible aspiration. Impression:           - The procedure was aborted due to presence of food.                       - Food in the middle third of the esophagus.                       - No specimens collected. Recommendation:       - Patient has a contact number available for                        emergencies. The signs and symptoms of potential                        delayed complications were discussed with the patient.                        Return to normal activities tomorrow. Written discharge                        instructions were provided to the patient.                       - Resume previous diet.                       - Continue present medications.                       - Repeat upper endoscopy in 1 week  because the                        preparation was poor. Procedure Code(s):    --- Professional ---                       [id number], 52, Esophagoscopy, flexible, transoral;                        diagnostic, including collection of specimen(s) by                        brushing or washing, when performed (separate procedure) Diagnosis Code(s):    --- Professional ---                       Z53.8, Procedure and treatment not carried out for                        other reasons                       T18.128A, Food in esophagus causing other injury,                        initial encounter                       R63.4,  Abnormal weight loss CPT copyright 2016 [employer]. All rights reserved. The codes documented in this report are preliminary and upon coder review may  be revised to meet current compliance requirements. Jonathon Bellows, MD Jonathon Bellows MD, MD 10/25/2016 11:08:05 AM This report has been signed electronically. Number of Addenda: 0 Note Initiated On: 10/25/2016 10:54 AM      [facility]

## 2016-10-25 NOTE — Anesthesia Preprocedure Evaluation (Addendum)
Anesthesia Evaluation  Patient identified by MRN, date of birth, ID band Patient awake    Reviewed: Allergy & Precautions, H&P , NPO status , Patient's Chart, lab work & pertinent test results, reviewed documented beta blocker date and time   Airway Mallampati: II   Neck ROM: full    Dental  (+) Poor Dentition   Pulmonary neg pulmonary ROS, COPD, Current Smoker,    Pulmonary exam normal        Cardiovascular negative cardio ROS Normal cardiovascular exam Rhythm:regular Rate:Normal     Neuro/Psych PSYCHIATRIC DISORDERS negative neurological ROS  negative psych ROS   GI/Hepatic negative GI ROS, Neg liver ROS,   Endo/Other  negative endocrine ROS  Renal/GU negative Renal ROS  negative genitourinary   Musculoskeletal   Abdominal   Peds  Hematology negative hematology ROS (+)   Anesthesia Other Findings Past Medical History: No date: COPD (chronic obstructive pulmonary disease) (* No date: High cholesterol No date: Paranoid schizophrenia ([place]) Past Surgical History: No date: HAND SURGERY BMI    Body Mass Index:  12.97 kg/m     Reproductive/Obstetrics negative OB ROS                             Anesthesia Physical Anesthesia Plan  ASA: III  Anesthesia Plan: General   Post-op Pain Management:    Induction:   Airway Management Planned:   Additional Equipment:   Intra-op Plan:   Post-operative Plan:   Informed Consent: I have reviewed the patients History and Physical, chart, labs and discussed the procedure including the risks, benefits and alternatives for the proposed anesthesia with the patient or authorized representative who has indicated his/her understanding and acceptance.   Dental Advisory Given  Plan Discussed with: CRNA  Anesthesia Plan Comments:         Anesthesia Quick Evaluation

## 2016-10-25 NOTE — Anesthesia Postprocedure Evaluation (Signed)
Anesthesia Post Note  Patient: Roberto Davis  Procedure(s) Performed: Procedure(s) (LRB): ESOPHAGOGASTRODUODENOSCOPY (EGD) WITH PROPOFOL (N/A)  Patient location during evaluation: Endoscopy Anesthesia Type: General Level of consciousness: awake and alert Pain management: pain level controlled Vital Signs Assessment: post-procedure vital signs reviewed and stable Respiratory status: spontaneous breathing, nonlabored ventilation and respiratory function stable Cardiovascular status: blood pressure returned to baseline and stable Postop Assessment: no signs of nausea or vomiting Anesthetic complications: no     Last Vitals:  Vitals:   10/25/16 1140 10/25/16 1145  BP:  (!) 86/67  Pulse: 68 62  Resp: 10 13  Temp:      Last Pain:  Vitals:   10/25/16 1117  TempSrc: Tympanic                 Tadashi Burkel

## 2016-10-25 NOTE — Transfer of Care (Signed)
Immediate Anesthesia Transfer of Care Note  Patient: Roberto Davis  Procedure(s) Performed: Procedure(s): ESOPHAGOGASTRODUODENOSCOPY (EGD) WITH PROPOFOL (N/A)  Patient Location: PACU and Endoscopy Unit  Anesthesia Type:General  Level of Consciousness: awake, oriented and patient cooperative  Airway & Oxygen Therapy: Patient Spontanous Breathing and Patient connected to nasal cannula oxygen  Post-op Assessment: Report given to RN and Post -op Vital signs reviewed and stable  Post vital signs: Reviewed and stable  Last Vitals:  Vitals:   10/25/16 0950  Pulse: 87  Resp: 18  Temp: (!) 35.7 C    Last Pain:  Vitals:   10/25/16 0950  TempSrc: Tympanic         Complications: No apparent anesthesia complications

## 2016-10-26 ENCOUNTER — Encounter: Payer: Self-pay | Admitting: Gastroenterology

## 2016-10-30 ENCOUNTER — Other Ambulatory Visit: Payer: Self-pay

## 2016-10-30 ENCOUNTER — Telehealth: Payer: Self-pay | Admitting: Gastroenterology

## 2016-10-30 DIAGNOSIS — R634 Abnormal weight loss: Secondary | ICD-10-CM

## 2016-10-30 NOTE — Telephone Encounter (Signed)
Mariann Laster, patients nurse, called and stated that patient is down to 92.8 pounds and wasn't cleaned out at last colonoscopy. Does he need an x-ray? They are concerned about his weight. Please call asap

## 2016-10-30 NOTE — Telephone Encounter (Signed)
Spoke with Roberto Davis, caretaker. She stated pt is still losing weight. She stated after EGD Dr. Vicente Males had mentioned an Xray he wanted him to do as he had food in his stomach and had to abort the procedure. She says we have to do something. I informed her as long as he refuses these procedures we cannot force him to have the colonoscopy of which Dr. Vicente Males has highly recommended. Roberto Davis felt this is due to pt not taking his medications for his mental health. She will contact his POA to discuss possibly taking him to [facility] for treatment. We have ordered the UGI, Appt info is below.   Pt has been schedule for an UGI series at [facility] on Thursday, January 18th @ 11:00am. Roberto Davis was notified of this appt, date, time, location and prep.

## 2016-11-01 ENCOUNTER — Emergency Department: Payer: Medicare Other

## 2016-11-01 ENCOUNTER — Ambulatory Visit
Admission: RE | Admit: 2016-11-01 | Discharge: 2016-11-01 | Disposition: A | Discharge status: Home / Self Care | Payer: Medicare Other | Source: Ambulatory Visit | Attending: Gastroenterology | Admitting: Gastroenterology

## 2016-11-01 ENCOUNTER — Inpatient Hospital Stay
Admission: EM | Admit: 2016-11-01 | Discharge: 2016-11-07 | DRG: 374 | Disposition: A | Discharge status: Other Acute Inpatient Hospital | Payer: Medicare Other | Attending: Internal Medicine | Admitting: Internal Medicine

## 2016-11-01 ENCOUNTER — Encounter: Payer: Self-pay | Admitting: Emergency Medicine

## 2016-11-01 DIAGNOSIS — E860 Dehydration: Secondary | ICD-10-CM | POA: Diagnosis present

## 2016-11-01 DIAGNOSIS — F209 Schizophrenia, unspecified: Secondary | ICD-10-CM | POA: Diagnosis not present

## 2016-11-01 DIAGNOSIS — E876 Hypokalemia: Secondary | ICD-10-CM | POA: Diagnosis present

## 2016-11-01 DIAGNOSIS — R627 Adult failure to thrive: Secondary | ICD-10-CM | POA: Diagnosis present

## 2016-11-01 DIAGNOSIS — E7800 Pure hypercholesterolemia, unspecified: Secondary | ICD-10-CM | POA: Diagnosis present

## 2016-11-01 DIAGNOSIS — R634 Abnormal weight loss: Secondary | ICD-10-CM | POA: Diagnosis not present

## 2016-11-01 DIAGNOSIS — L8990 Pressure ulcer of unspecified site, unspecified stage: Secondary | ICD-10-CM | POA: Insufficient documentation

## 2016-11-01 DIAGNOSIS — C159 Malignant neoplasm of esophagus, unspecified: Secondary | ICD-10-CM | POA: Diagnosis present

## 2016-11-01 DIAGNOSIS — E43 Unspecified severe protein-calorie malnutrition: Secondary | ICD-10-CM | POA: Diagnosis present

## 2016-11-01 DIAGNOSIS — E46 Unspecified protein-calorie malnutrition: Secondary | ICD-10-CM | POA: Diagnosis not present

## 2016-11-01 DIAGNOSIS — R64 Cachexia: Secondary | ICD-10-CM | POA: Diagnosis present

## 2016-11-01 DIAGNOSIS — F200 Paranoid schizophrenia: Secondary | ICD-10-CM | POA: Diagnosis present

## 2016-11-01 DIAGNOSIS — J449 Chronic obstructive pulmonary disease, unspecified: Secondary | ICD-10-CM | POA: Diagnosis present

## 2016-11-01 DIAGNOSIS — Z7982 Long term (current) use of aspirin: Secondary | ICD-10-CM

## 2016-11-01 DIAGNOSIS — K222 Esophageal obstruction: Secondary | ICD-10-CM | POA: Diagnosis not present

## 2016-11-01 DIAGNOSIS — F17210 Nicotine dependence, cigarettes, uncomplicated: Secondary | ICD-10-CM | POA: Diagnosis present

## 2016-11-01 DIAGNOSIS — N179 Acute kidney failure, unspecified: Secondary | ICD-10-CM | POA: Diagnosis present

## 2016-11-01 DIAGNOSIS — K449 Diaphragmatic hernia without obstruction or gangrene: Secondary | ICD-10-CM | POA: Diagnosis present

## 2016-11-01 DIAGNOSIS — Z681 Body mass index (BMI) 19 or less, adult: Secondary | ICD-10-CM | POA: Diagnosis not present

## 2016-11-01 DIAGNOSIS — Z79899 Other long term (current) drug therapy: Secondary | ICD-10-CM

## 2016-11-01 DIAGNOSIS — R200 Anesthesia of skin: Secondary | ICD-10-CM | POA: Diagnosis not present

## 2016-11-01 DIAGNOSIS — Z452 Encounter for adjustment and management of vascular access device: Secondary | ICD-10-CM

## 2016-11-01 LAB — CBC WITH DIFFERENTIAL/PLATELET
Basophils Absolute: 0 10*3/uL (ref 0–0.1)
Basophils Relative: 0 %
Eosinophils Absolute: 0 10*3/uL (ref 0–0.7)
Eosinophils Relative: 0 %
HEMATOCRIT: 45.1 % (ref 40.0–52.0)
HEMOGLOBIN: 16 g/dL (ref 13.0–18.0)
LYMPHS ABS: 1.8 10*3/uL (ref 1.0–3.6)
Lymphocytes Relative: 17 %
MCH: 31.6 pg (ref 26.0–34.0)
MCHC: 35.5 g/dL (ref 32.0–36.0)
MCV: 89 fL (ref 80.0–100.0)
MONO ABS: 0.3 10*3/uL (ref 0.2–1.0)
MONOS PCT: 3 %
NEUTROS ABS: 8.5 10*3/uL — AB (ref 1.4–6.5)
NEUTROS PCT: 80 %
Platelets: 287 10*3/uL (ref 150–440)
RBC: 5.06 MIL/uL (ref 4.40–5.90)
RDW: 13.6 % (ref 11.5–14.5)
WBC: 10.7 10*3/uL — ABNORMAL HIGH (ref 3.8–10.6)

## 2016-11-01 LAB — COMPREHENSIVE METABOLIC PANEL
ALK PHOS: 64 U/L (ref 38–126)
ALT: 15 U/L — ABNORMAL LOW (ref 17–63)
ANION GAP: 13 (ref 5–15)
AST: 18 U/L (ref 15–41)
Albumin: 3.6 g/dL (ref 3.5–5.0)
BILIRUBIN TOTAL: 1.1 mg/dL (ref 0.3–1.2)
BUN: 52 mg/dL — AB (ref 6–20)
CO2: 28 mmol/L (ref 22–32)
CREATININE: 2.06 mg/dL — AB (ref 0.61–1.24)
Calcium: 9.1 mg/dL (ref 8.9–10.3)
Chloride: 93 mmol/L — ABNORMAL LOW (ref 101–111)
GFR, EST AFRICAN AMERICAN: 40 mL/min — AB (ref >60)
GFR, EST NON AFRICAN AMERICAN: 35 mL/min — AB (ref >60)
GLUCOSE: 89 mg/dL (ref 65–99)
POTASSIUM: 2.4 mmol/L — AB (ref 3.5–5.1)
Sodium: 134 mmol/L — ABNORMAL LOW (ref 135–145)
Total Protein: 6.4 g/dL — ABNORMAL LOW (ref 6.5–8.1)

## 2016-11-01 LAB — URINALYSIS, COMPLETE (UACMP) WITH MICROSCOPIC
BACTERIA UA: NONE SEEN
BILIRUBIN URINE: NEGATIVE
Glucose, UA: 50 mg/dL — AB
Hgb urine dipstick: NEGATIVE
Ketones, ur: NEGATIVE mg/dL
Leukocytes, UA: NEGATIVE
Nitrite: NEGATIVE
PROTEIN: NEGATIVE mg/dL
Specific Gravity, Urine: >1.046 — ABNORMAL HIGH (ref 1.005–1.030)
pH: 5 (ref 5.0–8.0)

## 2016-11-01 LAB — TROPONIN I: Troponin I: 0.04 ng/mL (ref <0.03)

## 2016-11-01 MED ORDER — PNEUMOCOCCAL VAC POLYVALENT 25 MCG/0.5ML IJ INJ: 0.5000 mL | INJECTION | INTRAMUSCULAR | Status: DC | PRN

## 2016-11-01 MED ORDER — IOPAMIDOL (ISOVUE-300) INJECTION 61%: 30.0000 mL | Freq: Once | INTRAVENOUS | Status: DC | PRN

## 2016-11-01 MED ORDER — SODIUM CHLORIDE 0.9 % IV SOLN
Freq: Once | INTRAVENOUS | Status: AC
  Administered 2016-11-01: 1000 mL via INTRAVENOUS

## 2016-11-01 MED ORDER — KCL IN DEXTROSE-NACL 40-5-0.45 MEQ/L-%-% IV SOLN
INTRAVENOUS | Status: DC
  Administered 2016-11-01: 1000 mL via INTRAVENOUS
  Administered 2016-11-02 – 2016-11-03 (×3): via INTRAVENOUS
  Filled 2016-11-01 (×9): qty 1000

## 2016-11-01 MED ORDER — IOPAMIDOL (ISOVUE-300) INJECTION 61%
75.0000 mL | Freq: Once | INTRAVENOUS | Status: AC | PRN
  Administered 2016-11-01: 75 mL via INTRAVENOUS

## 2016-11-01 NOTE — H&P (Signed)
[facility] [place] NAME: Roberto Davis    MR#:  [id number]  DATE OF BIRTH:  [birth year]  DATE OF ADMISSION:  11/01/2016  PRIMARY CARE PHYSICIAN: No PCP Per Patient   REQUESTING/REFERRING PHYSICIAN: Dr. Lenise Arena.   CHIEF COMPLAINT:  No chief complaint on file.   HISTORY OF PRESENT ILLNESS:  Roberto Davis  is a 56 y.o. male with a known history of COPD, paranoid schizophrenia who presents to the hospital due to ongoing dysphagia and weight loss and noted to be acute kidney injury with severe hypokalemia. Patient underwent a GI series today which showed a high-grade esophageal stricture suspicious for possible underlying malignancy. Given his severe cachexia failure to thrive and noted to be in acute kidney injury and severe hypokalemia hospitalist services were called for treatment evaluation. Patient admits to about a 50-60 pound weight loss over the past 2-3 months. He denies any chest pains, shortness of breath, nausea vomiting hemoptysis hematemesis, melena hematochezia.  PAST MEDICAL HISTORY:   Past Medical History:  Diagnosis Date  . COPD (chronic obstructive pulmonary disease) ([place])   . High cholesterol   . Paranoid schizophrenia ([place])     PAST SURGICAL HISTORY:   Past Surgical History:  Procedure Laterality Date  . DG BARIUM SWALLOW ([place] HX)    . ESOPHAGOGASTRODUODENOSCOPY (EGD) WITH PROPOFOL N/A 10/25/2016   Procedure: ESOPHAGOGASTRODUODENOSCOPY (EGD) WITH PROPOFOL;  Surgeon: Jonathon Bellows, MD;  Location: [facility] ENDOSCOPY;  Service: Endoscopy;  Laterality: N/A;  . HAND SURGERY      SOCIAL HISTORY:   Social History  Substance Use Topics  . Smoking status: Current Every Day Smoker    Packs/day: 0.25    Years: 40.00    Types: Cigarettes  . Smokeless tobacco: Never Used  . Alcohol use No    FAMILY HISTORY:   Family History  Problem Relation Age of Onset  . Heart failure Mother     DRUG ALLERGIES:    Allergies  Allergen Reactions  . Haldol [Haloperidol Lactate] Other (See Comments)    Shaking uncontrollable   . Ampicillin   . Depakote [Divalproex Sodium]   . Lamictal [Lamotrigine]   . Penicillins Other (See Comments)    Reaction unknown-childhood allergy    REVIEW OF SYSTEMS:   Review of Systems  Constitutional: Positive for malaise/fatigue and weight loss. Negative for fever.  HENT: Negative for congestion, nosebleeds and tinnitus.   Eyes: Negative for blurred vision, double vision and redness.  Respiratory: Negative for cough, hemoptysis and shortness of breath.   Cardiovascular: Negative for chest pain, orthopnea, leg swelling and PND.  Gastrointestinal: Negative for abdominal pain, diarrhea, melena, nausea and vomiting.  Genitourinary: Negative for dysuria, hematuria and urgency.  Musculoskeletal: Negative for falls and joint pain.  Neurological: Negative for dizziness, tingling, sensory change, focal weakness, seizures, weakness and headaches.  Endo/Heme/Allergies: Negative for polydipsia. Does not bruise/bleed easily.  Psychiatric/Behavioral: Negative for depression and memory loss. The patient is not nervous/anxious.     MEDICATIONS AT HOME:   Prior to Admission medications   Medication Sig Start Date End Date Taking? Authorizing Provider  aspirin EC 81 MG tablet Take 81 mg by mouth daily.   Yes Historical Provider, MD  benztropine (COGENTIN) 1 MG tablet Take 1 mg by mouth daily.  05/25/13  Yes Historical Provider, MD  Cholecalciferol (VITAMIN D3) 5000 units CAPS Take 5,000 Units by mouth daily.   Yes Historical Provider, MD  ibuprofen (ADVIL,MOTRIN) 400 MG tablet Take 400 mg  by mouth 3 (three) times daily as needed for pain or fever (for headache).   Yes Historical Provider, MD  LORazepam (ATIVAN) 2 MG tablet Take 2 mg by mouth at bedtime. And prn   Yes Historical Provider, MD  OLANZapine zydis (ZYPREXA) 10 MG disintegrating tablet Take 10 mg by mouth at bedtime.    Yes Historical Provider, MD  ondansetron (ZOFRAN) 4 MG tablet Take 4 mg by mouth as needed for nausea or vomiting. q 4-6 h   Yes Historical Provider, MD  pantoprazole (PROTONIX) 40 MG tablet Take 40 mg by mouth 2 (two) times daily.    Yes Historical Provider, MD  rosuvastatin (CRESTOR) 20 MG tablet Take 20 mg by mouth at bedtime.   Yes Historical Provider, MD  sucralfate (CARAFATE) 1 g tablet Take 1 g by mouth 4 (four) times daily.   Yes Historical Provider, MD  traMADol (ULTRAM) 50 MG tablet Take 1 tablet (50 mg total) by mouth every 6 (six) hours as needed. 07/18/16  Yes Milton Ferguson, MD  triamcinolone cream (KENALOG) 0.1 % Apply 1 application topically 3 (three) times daily as needed (for healing).   Yes Historical Provider, MD  TRILIPIX 135 MG capsule Take 135 mg by mouth daily.  06/23/13  Yes Historical Provider, MD  VENTOLIN HFA 108 (90 BASE) MCG/ACT inhaler Inhale 1 puff into the lungs every 6 (six) hours as needed.  05/21/13  Yes Historical Provider, MD  ZETIA 10 MG tablet Take 10 mg by mouth daily.  06/23/13  Yes Historical Provider, MD      VITAL SIGNS:  Blood pressure 110/79, pulse (!) 50, temperature 97 F (36.1 C), temperature source Rectal, resp. rate 13, height 5\' 11"  (1.803 m), weight 41.9 kg (92 lb 6.4 oz), SpO2 100 %.  PHYSICAL EXAMINATION:  Physical Exam  GENERAL:  56 y.o.-year-old severely cachectic male patient lying in the bed in no acute distress.  EYES: Pupils equal, round, reactive to light and accommodation. No scleral icterus. Extraocular muscles intact.  HEENT: Head atraumatic, normocephalic. Oropharynx and nasopharynx clear. No oropharyngeal erythema, moist oral mucosa  NECK:  Supple, no jugular venous distention. No thyroid enlargement, no tenderness.  LUNGS: Normal breath sounds bilaterally, no wheezing, rales, rhonchi. No use of accessory muscles of respiration.  CARDIOVASCULAR: S1, S2 RRR. No murmurs, rubs, gallops, clicks.  ABDOMEN: Flat, nontender, nondistended.  Bowel sounds present. No organomegaly or mass.  EXTREMITIES: No pedal edema, cyanosis, or clubbing. + 2 pedal & radial pulses b/l.   NEUROLOGIC: Cranial nerves II through XII are intact. No focal Motor or sensory deficits appreciated b/l PSYCHIATRIC: The patient is alert and oriented x 3. .  SKIN: No obvious rash, lesion, or ulcer.   LABORATORY PANEL:   CBC  Recent Labs Lab 11/01/16 1244  WBC 10.7*  HGB 16.0  HCT 45.1  PLT 287   ------------------------------------------------------------------------------------------------------------------  Chemistries   Recent Labs Lab 11/01/16 1244  NA 134*  K 2.4*  CL 93*  CO2 28  GLUCOSE 89  BUN 52*  CREATININE 2.06*  CALCIUM 9.1  AST 18  ALT 15*  ALKPHOS 64  BILITOT 1.1   ------------------------------------------------------------------------------------------------------------------  Cardiac Enzymes  Recent Labs Lab 11/01/16 1244  TROPONINI 0.04*   ------------------------------------------------------------------------------------------------------------------  RADIOLOGY:  Ct Chest W Contrast  Result Date: 11/01/2016 CLINICAL DATA:  Distal esophageal stricture on esophagram. Weight loss EXAM: CT CHEST, ABDOMEN, AND PELVIS WITH CONTRAST TECHNIQUE: Multidetector CT imaging of the chest, abdomen and pelvis was performed following the standard protocol during bolus  administration of intravenous contrast. CONTRAST:  7mL ISOVUE-300 IOPAMIDOL (ISOVUE-300) INJECTION 61%, 1 ISOVUE-300 IOPAMIDOL (ISOVUE-300) INJECTION 61% COMPARISON:  Esophagram 11/01/2016 FINDINGS: CT CHEST FINDINGS Cardiovascular: No significant vascular findings. Normal heart size. No pericardial effusion. Mediastinum/Nodes: No axillary or supraclavicular adenopathy. The distal esophagus is patulous and distended to 5.7 cm. There is some retained barium from esophagram 1 day prior. There is circumferential narrowing of esophagus over a 3 cm segment leading up  to the GE junction. There is a hiatal hernia. Lungs/Pleura: There is mild ground-glass airspace opacities in LEFT lower lobe. There branching pattern. There is a material within the LEFT lower lobe bronchi. Findings suggest aspiration pneumonitis. Similar findings in the Right upper lobe laterally. Musculoskeletal: Very little body fat the thorax. No aggressive osseous lesion. CT ABDOMEN AND PELVIS FINDINGS Hepatobiliary: No focal hepatic lesion. No biliary ductal dilatation. Gallbladder is normal. Common bile duct is normal. Pancreas: Pancreas is normal. No ductal dilatation. No pancreatic inflammation. Spleen: Normal spleen Adrenals/urinary tract: Adrenal glands and kidneys are normal. The ureters and bladder normal. Stomach/Bowel: Moderate hiatal hernia noted. Distal esophageal stricture. Examination of the peritoneal space difficult due to very little body fat high-density barium from 1 day prior. No gross abnormality of the small bowel or colon. Vascular/Lymphatic: Abdominal aorta is normal caliber with atherosclerotic calcification. There is no retroperitoneal or periportal lymphadenopathy. No pelvic lymphadenopathy. Reproductive: Prostate is large Other: No free fluid. Musculoskeletal: No aggressive osseous lesion. IMPRESSION: Chest Impression: 1. Circumferential stricturing of the distal esophagus with high-grade obstruction. Differential would include esophageal neoplasm versus ACHALASIA. 2. Fine airspace disease in the LEFT lower lobe with endobronchial filling defects. Concern for aspiration pneumonitis Abdomen / Pelvis Impression: 1. Very little body fat within the peritoneal space or subcutaneous tissue. 2. Evaluation of the peritoneal contents difficulty high-grade high-density barium. 3. No evidence of metastatic disease within the liver. No gross evidence of lymphadenopathy. Electronically Signed   By: Suzy Bouchard M.D.   On: 11/01/2016 14:45   Ct Abdomen Pelvis W Contrast  Result Date:  11/01/2016 CLINICAL DATA:  Distal esophageal stricture on esophagram. Weight loss EXAM: CT CHEST, ABDOMEN, AND PELVIS WITH CONTRAST TECHNIQUE: Multidetector CT imaging of the chest, abdomen and pelvis was performed following the standard protocol during bolus administration of intravenous contrast. CONTRAST:  22mL ISOVUE-300 IOPAMIDOL (ISOVUE-300) INJECTION 61%, 1 ISOVUE-300 IOPAMIDOL (ISOVUE-300) INJECTION 61% COMPARISON:  Esophagram 11/01/2016 FINDINGS: CT CHEST FINDINGS Cardiovascular: No significant vascular findings. Normal heart size. No pericardial effusion. Mediastinum/Nodes: No axillary or supraclavicular adenopathy. The distal esophagus is patulous and distended to 5.7 cm. There is some retained barium from esophagram 1 day prior. There is circumferential narrowing of esophagus over a 3 cm segment leading up to the GE junction. There is a hiatal hernia. Lungs/Pleura: There is mild ground-glass airspace opacities in LEFT lower lobe. There branching pattern. There is a material within the LEFT lower lobe bronchi. Findings suggest aspiration pneumonitis. Similar findings in the Right upper lobe laterally. Musculoskeletal: Very little body fat the thorax. No aggressive osseous lesion. CT ABDOMEN AND PELVIS FINDINGS Hepatobiliary: No focal hepatic lesion. No biliary ductal dilatation. Gallbladder is normal. Common bile duct is normal. Pancreas: Pancreas is normal. No ductal dilatation. No pancreatic inflammation. Spleen: Normal spleen Adrenals/urinary tract: Adrenal glands and kidneys are normal. The ureters and bladder normal. Stomach/Bowel: Moderate hiatal hernia noted. Distal esophageal stricture. Examination of the peritoneal space difficult due to very little body fat high-density barium from 1 day prior. No gross abnormality of the small bowel  or colon. Vascular/Lymphatic: Abdominal aorta is normal caliber with atherosclerotic calcification. There is no retroperitoneal or periportal lymphadenopathy. No  pelvic lymphadenopathy. Reproductive: Prostate is large Other: No free fluid. Musculoskeletal: No aggressive osseous lesion. IMPRESSION: Chest Impression: 1. Circumferential stricturing of the distal esophagus with high-grade obstruction. Differential would include esophageal neoplasm versus ACHALASIA. 2. Fine airspace disease in the LEFT lower lobe with endobronchial filling defects. Concern for aspiration pneumonitis Abdomen / Pelvis Impression: 1. Very little body fat within the peritoneal space or subcutaneous tissue. 2. Evaluation of the peritoneal contents difficulty high-grade high-density barium. 3. No evidence of metastatic disease within the liver. No gross evidence of lymphadenopathy. Electronically Signed   By: Suzy Bouchard M.D.   On: 11/01/2016 14:45   Dg Ugi W/high Density W/kub  Result Date: 11/01/2016 CLINICAL DATA:  Significant weight loss over the last 3 months greater than 50 pounds. Unable to swallow solids or liquids. EXAM: UPPER GI SERIES WITH KUB TECHNIQUE: After obtaining a scout radiograph a routine upper GI series was performed using thin barium. FLUOROSCOPY TIME:  Fluoroscopy Time:  42 seconds Radiation Exposure Index (if provided by the fluoroscopic device): 3.2 mGy Number of Acquired Spot Images: 0 COMPARISON:  None. FINDINGS: KUB: There is no bowel dilatation to suggest obstruction. There is no evidence of pneumoperitoneum, portal venous gas or pneumatosis. There are no pathologic calcifications along the expected course of the ureters. The osseous structures are unremarkable. Upper GI: The patient is unable to tolerate significant amounts of contrast. During the entire examination the patient was nauseous and continued to spit up saliva as well as any ingested barium. There is a high-grade stricture of the distal esophagus measuring 2.5 cm approximately in length. A small amount of contrast traverses the stricture. The esophagus proximal to the stricture is severely dilated  with secretions. IMPRESSION: 1. High-grade stricture of the distal esophagus measuring approximately 2.5 cm in length. Only a trickle of contrast traverses the stricture. The stricture is concerning for an inflammatory or neoplastic etiology. Recommend further evaluation with a CT of the chest and abdomen given the recently failed upper endoscopy. Electronically Signed   By: Kathreen Devoid   On: 11/01/2016 12:02     IMPRESSION AND PLAN:   56 year old male with past medical history paranoid schizophrenia, COPD, hyperlipidemia, who presents to the hospital due to ongoing weight loss, dysphagia and noted to have an upper GI series concerning for a high-grade esophageal stricture concerning for neoplasm.  1. Dysphagia-patient had an upper GI series which shows a high-grade esophageal stricture worrisome for malignancy. -This is a distal esophageal stricture. [facility] has had ongoing dysphagia to solids and liquids and about a 50 pound weight loss. [facility] consult gastroenterology and patient will likely need an endoscopy.  2. Acute kidney injury-secondary to dehydration. -We'll place on gentle IV fluids, follow BUN and creatinine.  3. Hypokalemia-secondary to poor by mouth intake and dehydration. -We'll replace potassium orally and intravenously, check magnesium level. - repeat level in a.m.   4. Adult Failure to thrive - due to malignancy and ongoing dysphagia.   5. Hyperlipidemia - cont. Crestor.   6. COPD - no acute exacerbation.  - cont. Albuterol inhaler PRN  7. Paranoid Schizophrenia - cont. Zyprexa.     All the records are reviewed and case discussed with ED provider. Management plans discussed with the patient, family and they are in agreement.  CODE STATUS: Full code  TOTAL TIME TAKING CARE OF THIS PATIENT: 45 minutes.    Shiloh  J M.D on 11/01/2016 at 3:22 PM  Between 7am to 6pm - Pager - [phone]  After 6pm go to [email or web address] - password [email or web address] [facility]  [facility]  Office  [phone]  CC: Primary care physician; No PCP Per Patient

## 2016-11-01 NOTE — ED Notes (Signed)
Patient transported to CT 

## 2016-11-01 NOTE — ED Triage Notes (Signed)
Pt presents to ED with his assisted living facility owner. Per group home owner pt was sent to ED after having a barium swallow study done. 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 Pt states that he feels like he can breathe okay at this time.

## 2016-11-01 NOTE — ED Provider Notes (Signed)
[facility] Emergency Department Provider Note        Time seen: ----------------------------------------- 12:42 PM on 11/01/2016 -----------------------------------------    I have reviewed the triage vital signs and the nursing notes.   HISTORY  Chief Complaint No chief complaint on file.    HPI Roberto Davis is a 56 y.o. male who presents to the ER for a 50 pound weight loss. Patient comes from a group home after having had a barium swallow done today. 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 Patient states he feels like he can breathe okay at this time. In the past he did not want to be admitted the hospital currently he feels so weak that he can't even walk. Caregivers report he cannot really tolerate his own saliva.   Past Medical History:  Diagnosis Date  . COPD (chronic obstructive pulmonary disease) ([place])   . High cholesterol   . Paranoid schizophrenia [facility])     Patient Active Problem List   Diagnosis Date Noted  . Paranoid schizophrenia ([place])   . High cholesterol   . COPD (chronic obstructive pulmonary disease) ([place])     Past Surgical History:  Procedure Laterality Date  . DG BARIUM SWALLOW ([place] HX)    . ESOPHAGOGASTRODUODENOSCOPY (EGD) WITH PROPOFOL N/A 10/25/2016   Procedure: ESOPHAGOGASTRODUODENOSCOPY (EGD) WITH PROPOFOL;  Surgeon: Jonathon Bellows, MD;  Location: [facility] ENDOSCOPY;  Service: Endoscopy;  Laterality: N/A;  . HAND SURGERY      Allergies Haldol [haloperidol lactate]; Ampicillin; Depakote [divalproex sodium]; Lamictal [lamotrigine]; and Penicillins  Social History Social History  Substance Use Topics  . Smoking status: Current Every Day Smoker    Packs/day: 0.25    Types: Cigarettes  . Smokeless tobacco: Never Used  . Alcohol use No    Review of Systems Constitutional: Negative for fever. ENT: Positive for dysphagia Cardiovascular: Negative for chest  pain. Respiratory: Negative for shortness of breath. Gastrointestinal: Negative for abdominal pain, vomiting and diarrhea. Genitourinary: Negative for dysuria. Musculoskeletal: Negative for back pain. Skin: Negative for rash. Neurological: Positive for generalized weakness  10-point ROS otherwise negative.  ____________________________________________   PHYSICAL EXAM:  VITAL SIGNS: ED Triage Vitals  Enc Vitals Group     BP 11/01/16 1227 (!) 81/56     Pulse --      Resp 11/01/16 1227 18     Temp 11/01/16 1227 97 F (36.1 C)     Temp Source 11/01/16 1227 Rectal     SpO2 --      Weight 11/01/16 1220 92 lb 6.4 oz (41.9 kg)     Height 11/01/16 1220 5\' 11"  (1.803 m)     Head Circumference --      Peak Flow --      Pain Score 11/01/16 1227 0     Pain Loc --      Pain Edu? --      Excl. in [place]? --     Constitutional: Alert and oriented. Ill-appearing, cachectic Eyes: Conjunctivae are normal. PERRL. Normal extraocular movements. ENT   Head: Normocephalic and atraumatic.   Nose: No congestion/rhinnorhea.   Mouth/Throat: Mucous membranes are moist. Posterior pharynx is mildly erythematous   Neck: No stridor. Cardiovascular: Normal rate, regular rhythm. No murmurs, rubs, or gallops. Respiratory: Normal respiratory effort without tachypnea nor retractions. Breath sounds are clear and equal bilaterally. No wheezes/rales/rhonchi. Gastrointestinal: Soft and nontender. emaciated Musculoskeletal: Nontender with normal range of motion in all extremities. No lower extremity tenderness  nor edema. Neurologic:  Normal speech and language. No gross focal neurologic deficits are appreciated.  Skin:  Skin is warm, dry and intact. No rash noted. Psychiatric: Mood and affect are normal ____________________________________________  EKG: Interpreted by me.Sinus rhythm rate of 53 bpm, normal PR interval, normal QRS, long QT  ____________________________________________  ED  COURSE:  Pertinent labs & imaging results that were available during my care of the patient were reviewed by me and considered in my medical decision making (see chart for details).  patient appears markedly cachectic and weak. We will assess with basic labs, give IV fluids and obtain CT imaging.  Procedures ____________________________________________   LABS (pertinent positives/negatives)  Labs Reviewed  CBC WITH DIFFERENTIAL/PLATELET - Abnormal; Notable for the following:       Result Value   WBC 10.7 (*)    Neutro Abs 8.5 (*)    All other components within normal limits  COMPREHENSIVE METABOLIC PANEL - Abnormal; Notable for the following:    Sodium 134 (*)    Potassium 2.4 (*)    Chloride 93 (*)    BUN 52 (*)    Creatinine, Ser 2.06 (*)    Total Protein 6.4 (*)    ALT 15 (*)    GFR calc non Af Amer 35 (*)    GFR calc Af Amer 40 (*)    All other components within normal limits  TROPONIN I - Abnormal; Notable for the following:    Troponin I 0.04 (*)    All other components within normal limits  URINALYSIS, COMPLETE (UACMP) WITH MICROSCOPIC    RADIOLOGY Images were viewed by me  CT of the chest/abdomen and pelvis IMPRESSION: Chest Impression:  1. Circumferential stricturing of the distal esophagus with high-grade obstruction. Differential would include esophageal neoplasm versus ACHALASIA. 2. Fine airspace disease in the LEFT lower lobe with endobronchial filling defects. Concern for aspiration pneumonitis  Abdomen / Pelvis Impression:  1. Very little body fat within the peritoneal space or subcutaneous tissue. 2. Evaluation of the peritoneal contents difficulty high-grade high-density barium. 3. No evidence of metastatic disease within the liver. No gross evidence of lymphadenopathy. ____________________________________________  FINAL ASSESSMENT AND PLAN  Acute renal failure, failure to thrive, hypokalemia, Esophageal obstruction  Plan: Patient with  labs and imaging as dictated above. Patient sent here today after upper GI showed severe narrowing of the distal esophagus. CT scans concerning for neoplasm versus achalasia. He has acute kidney injury from dehydration and poor by mouth intake. We have started him on IV fluids as well as IV potassium repletion. He will need admission and GI consultation.   Earleen Newport, MD   Note: This note was generated in part or whole with voice recognition software. Voice recognition is usually quite accurate but there are transcription errors that can and very often do occur. I apologize for any typographical errors that were not detected and corrected.     Earleen Newport, MD 11/01/16 [phone]

## 2016-11-02 ENCOUNTER — Inpatient Hospital Stay: Payer: Medicare Other | Admitting: Anesthesiology

## 2016-11-02 ENCOUNTER — Encounter
Admission: EM | Disposition: A | Discharge status: Other Acute Inpatient Hospital | Payer: Self-pay | Source: Home / Self Care | Attending: Internal Medicine

## 2016-11-02 DIAGNOSIS — E43 Unspecified severe protein-calorie malnutrition: Secondary | ICD-10-CM | POA: Insufficient documentation

## 2016-11-02 DIAGNOSIS — R634 Abnormal weight loss: Secondary | ICD-10-CM | POA: Diagnosis not present

## 2016-11-02 DIAGNOSIS — L8990 Pressure ulcer of unspecified site, unspecified stage: Secondary | ICD-10-CM | POA: Insufficient documentation

## 2016-11-02 DIAGNOSIS — K222 Esophageal obstruction: Secondary | ICD-10-CM | POA: Diagnosis not present

## 2016-11-02 DIAGNOSIS — F200 Paranoid schizophrenia: Secondary | ICD-10-CM

## 2016-11-02 HISTORY — PX: ESOPHAGOGASTRODUODENOSCOPY (EGD) WITH PROPOFOL: SHX5813

## 2016-11-02 LAB — BASIC METABOLIC PANEL
Anion gap: 8 (ref 5–15)
BUN: 49 mg/dL — AB (ref 6–20)
CHLORIDE: 98 mmol/L — AB (ref 101–111)
CO2: 28 mmol/L (ref 22–32)
CREATININE: 1.11 mg/dL (ref 0.61–1.24)
Calcium: 8.9 mg/dL (ref 8.9–10.3)
GFR calc Af Amer: >60 mL/min (ref >60)
Glucose, Bld: 163 mg/dL — ABNORMAL HIGH (ref 65–99)
POTASSIUM: 3.1 mmol/L — AB (ref 3.5–5.1)
Sodium: 134 mmol/L — ABNORMAL LOW (ref 135–145)

## 2016-11-02 LAB — CBC
HEMATOCRIT: 37.6 % — AB (ref 40.0–52.0)
Hemoglobin: 13 g/dL (ref 13.0–18.0)
MCH: 30.7 pg (ref 26.0–34.0)
MCHC: 34.6 g/dL (ref 32.0–36.0)
MCV: 88.8 fL (ref 80.0–100.0)
Platelets: 279 10*3/uL (ref 150–440)
RBC: 4.24 MIL/uL — ABNORMAL LOW (ref 4.40–5.90)
RDW: 13.3 % (ref 11.5–14.5)
WBC: 9.9 10*3/uL (ref 3.8–10.6)

## 2016-11-02 LAB — MAGNESIUM: MAGNESIUM: 1.8 mg/dL (ref 1.7–2.4)

## 2016-11-02 LAB — MRSA PCR SCREENING: MRSA by PCR: POSITIVE — AB

## 2016-11-02 SURGERY — ESOPHAGOGASTRODUODENOSCOPY (EGD) WITH PROPOFOL
Anesthesia: General

## 2016-11-02 MED ORDER — TRAMADOL HCL 50 MG PO TABS: 50.0000 mg | ORAL_TABLET | Freq: Four times a day (QID) | ORAL | Status: DC | PRN

## 2016-11-02 MED ORDER — SODIUM CHLORIDE 0.9 % IV BOLUS (SEPSIS)
500.0000 mL | Freq: Once | INTRAVENOUS | Status: AC
  Administered 2016-11-02: 500 mL via INTRAVENOUS

## 2016-11-02 MED ORDER — SODIUM CHLORIDE 0.9% FLUSH
3.0000 mL | Freq: Two times a day (BID) | INTRAVENOUS | Status: DC
  Administered 2016-11-02 – 2016-11-07 (×8): 3 mL via INTRAVENOUS

## 2016-11-02 MED ORDER — LORAZEPAM 2 MG/ML IJ SOLN
1.0000 mg | Freq: Four times a day (QID) | INTRAMUSCULAR | Status: DC | PRN
  Administered 2016-11-03 – 2016-11-05 (×3): 1 mg via INTRAVENOUS
  Filled 2016-11-02 (×3): qty 1

## 2016-11-02 MED ORDER — ONDANSETRON HCL 4 MG PO TABS: 4.0000 mg | ORAL_TABLET | Freq: Four times a day (QID) | ORAL | Status: DC | PRN

## 2016-11-02 MED ORDER — ACETAMINOPHEN 650 MG RE SUPP
650.0000 mg | Freq: Four times a day (QID) | RECTAL | Status: DC | PRN
  Administered 2016-11-02: 650 mg via RECTAL
  Filled 2016-11-02: qty 1

## 2016-11-02 MED ORDER — GLYCOPYRROLATE 0.2 MG/ML IJ SOLN
INTRAMUSCULAR | Status: DC | PRN
  Administered 2016-11-02: 0.2 mg via INTRAVENOUS

## 2016-11-02 MED ORDER — PANTOPRAZOLE SODIUM 40 MG PO TBEC
40.0000 mg | DELAYED_RELEASE_TABLET | Freq: Two times a day (BID) | ORAL | Status: DC
  Administered 2016-11-02: 40 mg via ORAL
  Filled 2016-11-02: qty 1

## 2016-11-02 MED ORDER — CHLORHEXIDINE GLUCONATE CLOTH 2 % EX PADS
6.0000 | MEDICATED_PAD | Freq: Every day | CUTANEOUS | Status: AC
  Administered 2016-11-02 – 2016-11-06 (×4): 6 via TOPICAL

## 2016-11-02 MED ORDER — OLANZAPINE 10 MG PO TBDP
10.0000 mg | ORAL_TABLET | Freq: Every day | ORAL | Status: DC
  Administered 2016-11-03 – 2016-11-06 (×4): 10 mg via ORAL
  Filled 2016-11-02 (×5): qty 1

## 2016-11-02 MED ORDER — LORAZEPAM 2 MG/ML IJ SOLN
2.0000 mg | Freq: Every day | INTRAMUSCULAR | Status: DC
  Administered 2016-11-03 – 2016-11-06 (×4): 2 mg via INTRAVENOUS
  Filled 2016-11-02 (×4): qty 1

## 2016-11-02 MED ORDER — MUPIROCIN 2 % EX OINT
1.0000 "application " | TOPICAL_OINTMENT | Freq: Two times a day (BID) | CUTANEOUS | Status: AC
  Administered 2016-11-02 – 2016-11-06 (×8): 1 via NASAL
  Filled 2016-11-02: qty 22

## 2016-11-02 MED ORDER — ROSUVASTATIN CALCIUM 20 MG PO TABS
20.0000 mg | ORAL_TABLET | Freq: Every day | ORAL | Status: DC
  Administered 2016-11-02: 20 mg via ORAL
  Filled 2016-11-02: qty 1

## 2016-11-02 MED ORDER — LORAZEPAM 2 MG PO TABS
2.0000 mg | ORAL_TABLET | Freq: Every day | ORAL | Status: DC
  Administered 2016-11-02: 2 mg via ORAL
  Filled 2016-11-02: qty 1

## 2016-11-02 MED ORDER — VITAMIN D 1000 UNITS PO TABS
5000.0000 [IU] | ORAL_TABLET | Freq: Every day | ORAL | Status: DC
  Administered 2016-11-02: 5000 [IU] via ORAL
  Filled 2016-11-02: qty 5

## 2016-11-02 MED ORDER — LIDOCAINE 2% (20 MG/ML) 5 ML SYRINGE
INTRAMUSCULAR | Status: DC | PRN
  Administered 2016-11-02: 100 mg via INTRAVENOUS

## 2016-11-02 MED ORDER — PHENOL 1.4 % MT LIQD
1.0000 | OROMUCOSAL | Status: DC | PRN
  Administered 2016-11-03 – 2016-11-04 (×4): 1 via OROMUCOSAL
  Filled 2016-11-02 (×4): qty 177

## 2016-11-02 MED ORDER — SUCRALFATE 1 G PO TABS
1.0000 g | ORAL_TABLET | Freq: Three times a day (TID) | ORAL | Status: DC
  Administered 2016-11-02: 1 g via ORAL
  Filled 2016-11-02: qty 1

## 2016-11-02 MED ORDER — LIDOCAINE HCL (PF) 2 % IJ SOLN
INTRAMUSCULAR | Status: AC
  Filled 2016-11-02: qty 2

## 2016-11-02 MED ORDER — SODIUM CHLORIDE 0.9 % IV SOLN
INTRAVENOUS | Status: DC
  Administered 2016-11-02: 1000 mL via INTRAVENOUS

## 2016-11-02 MED ORDER — ONDANSETRON HCL 4 MG PO TABS: 4.0000 mg | ORAL_TABLET | ORAL | Status: DC | PRN

## 2016-11-02 MED ORDER — PROPOFOL 10 MG/ML IV BOLUS
INTRAVENOUS | Status: AC
  Filled 2016-11-02: qty 20

## 2016-11-02 MED ORDER — EZETIMIBE 10 MG PO TABS
10.0000 mg | ORAL_TABLET | Freq: Every day | ORAL | Status: DC
  Administered 2016-11-02: 10 mg via ORAL
  Filled 2016-11-02: qty 1

## 2016-11-02 MED ORDER — SODIUM CHLORIDE 0.9 % IV SOLN
30.0000 meq | Freq: Once | INTRAVENOUS | Status: AC
  Administered 2016-11-02: 30 meq via INTRAVENOUS
  Filled 2016-11-02: qty 15

## 2016-11-02 MED ORDER — ACETAMINOPHEN 325 MG PO TABS: 650.0000 mg | ORAL_TABLET | Freq: Four times a day (QID) | ORAL | Status: DC | PRN

## 2016-11-02 MED ORDER — GLYCOPYRROLATE 0.2 MG/ML IJ SOLN
INTRAMUSCULAR | Status: AC
  Filled 2016-11-02: qty 1

## 2016-11-02 MED ORDER — PROPOFOL 10 MG/ML IV BOLUS
INTRAVENOUS | Status: DC | PRN
  Administered 2016-11-02: 50 mg via INTRAVENOUS
  Administered 2016-11-02: 30 mg via INTRAVENOUS

## 2016-11-02 MED ORDER — OLANZAPINE 10 MG PO TBDP
10.0000 mg | ORAL_TABLET | Freq: Every day | ORAL | Status: DC
  Administered 2016-11-02: 10 mg via ORAL
  Filled 2016-11-02 (×2): qty 1

## 2016-11-02 MED ORDER — IPRATROPIUM-ALBUTEROL 0.5-2.5 (3) MG/3ML IN SOLN
3.0000 mL | Freq: Once | RESPIRATORY_TRACT | Status: AC
  Administered 2016-11-02: 3 mL via RESPIRATORY_TRACT
  Filled 2016-11-02: qty 3

## 2016-11-02 MED ORDER — ENOXAPARIN SODIUM 30 MG/0.3ML ~~LOC~~ SOLN
30.0000 mg | Freq: Every day | SUBCUTANEOUS | Status: DC
  Administered 2016-11-02 – 2016-11-06 (×5): 30 mg via SUBCUTANEOUS
  Filled 2016-11-02 (×5): qty 0.3

## 2016-11-02 MED ORDER — CHLORHEXIDINE GLUCONATE 0.12 % MT SOLN
15.0000 mL | Freq: Two times a day (BID) | OROMUCOSAL | Status: DC
  Administered 2016-11-02 – 2016-11-05 (×3): 15 mL via OROMUCOSAL
  Filled 2016-11-02 (×5): qty 15

## 2016-11-02 MED ORDER — BISACODYL 10 MG RE SUPP
10.0000 mg | Freq: Every day | RECTAL | Status: DC | PRN
  Administered 2016-11-02: 10 mg via RECTAL
  Filled 2016-11-02: qty 1

## 2016-11-02 MED ORDER — ORAL CARE MOUTH RINSE
15.0000 mL | Freq: Two times a day (BID) | OROMUCOSAL | Status: DC
  Administered 2016-11-05 – 2016-11-06 (×3): 15 mL via OROMUCOSAL

## 2016-11-02 MED ORDER — ALBUTEROL SULFATE (2.5 MG/3ML) 0.083% IN NEBU: 3.0000 mL | INHALATION_SOLUTION | Freq: Four times a day (QID) | RESPIRATORY_TRACT | Status: DC | PRN

## 2016-11-02 MED ORDER — MAGNESIUM SULFATE 2 GM/50ML IV SOLN
2.0000 g | Freq: Once | INTRAVENOUS | Status: AC
  Administered 2016-11-02: 2 g via INTRAVENOUS
  Filled 2016-11-02: qty 50

## 2016-11-02 MED ORDER — PROPOFOL 500 MG/50ML IV EMUL
INTRAVENOUS | Status: DC | PRN
  Administered 2016-11-02: 120 ug/kg/min via INTRAVENOUS

## 2016-11-02 MED ORDER — ONDANSETRON HCL 4 MG/2ML IJ SOLN: 4.0000 mg | Freq: Four times a day (QID) | INTRAMUSCULAR | Status: DC | PRN

## 2016-11-02 MED ORDER — BENZTROPINE MESYLATE 1 MG PO TABS
1.0000 mg | ORAL_TABLET | Freq: Every day | ORAL | Status: DC
  Administered 2016-11-02: 1 mg via ORAL
  Filled 2016-11-02: qty 1

## 2016-11-02 NOTE — Progress Notes (Signed)
[place] [facility] [place] NAME: Roberto Davis    MR#:  [id number]  DATE OF BIRTH:  1960/11/26  SUBJECTIVE:  CHIEF COMPLAINT:  No chief complaint on file.  - Patient admitted with weight loss and dysphagia. EGD done today showing distal esophageal stricture and dilatation of esophagus proximal to that. -Recommended nothing by mouth and parenteral nutrition at this time. -Patient has psychiatric disorder. Very insistent about having coffee and needing a laxative for bowel movement. -His caregivers from group home or at bedside. Apparently wife is coming  REVIEW OF SYSTEMS:  Review of Systems  Unable to perform ROS: Psychiatric disorder    DRUG ALLERGIES:   Allergies  Allergen Reactions  . Haldol [Haloperidol Lactate] Other (See Comments)    Shaking uncontrollable   . Ampicillin   . Depakote [Divalproex Sodium]   . Lamictal [Lamotrigine]   . Penicillins Other (See Comments)    Reaction unknown-childhood allergy    VITALS:  Blood pressure (!) 89/65, pulse (!) 55, temperature 97.5 F (36.4 C), resp. rate 20, height 5\' 10"  (1.778 m), weight 42.4 kg (93 lb 6.4 oz), SpO2 100 %.  PHYSICAL EXAMINATION:  Physical Exam  GENERAL:  56 y.o.-year-old severely malnourished patient lying in the bed with no acute distress.  EYES: Pupils equal, round, reactive to light and accommodation. No scleral icterus. Extraocular muscles intact.  HEENT: Head atraumatic, normocephalic. Oropharynx and nasopharynx clear.  NECK:  Supple, no jugular venous distention. No thyroid enlargement, no tenderness.  LUNGS: Normal breath sounds bilaterally, no wheezing, rales,rhonchi or crepitation. No use of accessory muscles of respiration.  CARDIOVASCULAR: S1, S2 normal. No murmurs, rubs, or gallops.  ABDOMEN: Soft, nontender, nondistended. Bowel sounds present. No organomegaly or mass.  EXTREMITIES: No pedal edema, cyanosis, or clubbing.  NEUROLOGIC: Cranial nerves II  through XII are intact. Able to Move extremities in bed. Sensation intact, not following commands because he is very irritable PSYCHIATRIC: The patient is alert and oriented. Very angry  SKIN: No obvious rash, lesion, or ulcer.    LABORATORY PANEL:   CBC  Recent Labs Lab 11/02/16 0415  WBC 9.9  HGB 13.0  HCT 37.6*  PLT 279   ------------------------------------------------------------------------------------------------------------------  Chemistries   Recent Labs Lab 11/01/16 1244 11/02/16 0415  NA 134* 134*  K 2.4* 3.1*  CL 93* 98*  CO2 28 28  GLUCOSE 89 163*  BUN 52* 49*  CREATININE 2.06* 1.11  CALCIUM 9.1 8.9  MG  --  1.8  AST 18  --   ALT 15*  --   ALKPHOS 64  --   BILITOT 1.1  --    ------------------------------------------------------------------------------------------------------------------  Cardiac Enzymes  Recent Labs Lab 11/01/16 1244  TROPONINI 0.04*   ------------------------------------------------------------------------------------------------------------------  RADIOLOGY:  Ct Chest W Contrast  Result Date: 11/01/2016 CLINICAL DATA:  Distal esophageal stricture on esophagram. Weight loss EXAM: CT CHEST, ABDOMEN, AND PELVIS WITH CONTRAST TECHNIQUE: Multidetector CT imaging of the chest, abdomen and pelvis was performed following the standard protocol during bolus administration of intravenous contrast. CONTRAST:  95mL ISOVUE-300 IOPAMIDOL (ISOVUE-300) INJECTION 61%, 1 ISOVUE-300 IOPAMIDOL (ISOVUE-300) INJECTION 61% COMPARISON:  Esophagram 11/01/2016 FINDINGS: CT CHEST FINDINGS Cardiovascular: No significant vascular findings. Normal heart size. No pericardial effusion. Mediastinum/Nodes: No axillary or supraclavicular adenopathy. The distal esophagus is patulous and distended to 5.7 cm. There is some retained barium from esophagram 1 day prior. There is circumferential narrowing of esophagus over a 3 cm segment leading up to the GE junction.  There is a hiatal hernia. Lungs/Pleura: There is mild ground-glass airspace opacities in LEFT lower lobe. There branching pattern. There is a material within the LEFT lower lobe bronchi. Findings suggest aspiration pneumonitis. Similar findings in the Right upper lobe laterally. Musculoskeletal: Very little body fat the thorax. No aggressive osseous lesion. CT ABDOMEN AND PELVIS FINDINGS Hepatobiliary: No focal hepatic lesion. No biliary ductal dilatation. Gallbladder is normal. Common bile duct is normal. Pancreas: Pancreas is normal. No ductal dilatation. No pancreatic inflammation. Spleen: Normal spleen Adrenals/urinary tract: Adrenal glands and kidneys are normal. The ureters and bladder normal. Stomach/Bowel: Moderate hiatal hernia noted. Distal esophageal stricture. Examination of the peritoneal space difficult due to very little body fat high-density barium from 1 day prior. No gross abnormality of the small bowel or colon. Vascular/Lymphatic: Abdominal aorta is normal caliber with atherosclerotic calcification. There is no retroperitoneal or periportal lymphadenopathy. No pelvic lymphadenopathy. Reproductive: Prostate is large Other: No free fluid. Musculoskeletal: No aggressive osseous lesion. IMPRESSION: Chest Impression: 1. Circumferential stricturing of the distal esophagus with high-grade obstruction. Differential would include esophageal neoplasm versus ACHALASIA. 2. Fine airspace disease in the LEFT lower lobe with endobronchial filling defects. Concern for aspiration pneumonitis Abdomen / Pelvis Impression: 1. Very little body fat within the peritoneal space or subcutaneous tissue. 2. Evaluation of the peritoneal contents difficulty high-grade high-density barium. 3. No evidence of metastatic disease within the liver. No gross evidence of lymphadenopathy. Electronically Signed   By: Suzy Bouchard M.D.   On: 11/01/2016 14:45   Ct Abdomen Pelvis W Contrast  Result Date: 11/01/2016 CLINICAL DATA:   Distal esophageal stricture on esophagram. Weight loss EXAM: CT CHEST, ABDOMEN, AND PELVIS WITH CONTRAST TECHNIQUE: Multidetector CT imaging of the chest, abdomen and pelvis was performed following the standard protocol during bolus administration of intravenous contrast. CONTRAST:  78mL ISOVUE-300 IOPAMIDOL (ISOVUE-300) INJECTION 61%, 1 ISOVUE-300 IOPAMIDOL (ISOVUE-300) INJECTION 61% COMPARISON:  Esophagram 11/01/2016 FINDINGS: CT CHEST FINDINGS Cardiovascular: No significant vascular findings. Normal heart size. No pericardial effusion. Mediastinum/Nodes: No axillary or supraclavicular adenopathy. The distal esophagus is patulous and distended to 5.7 cm. There is some retained barium from esophagram 1 day prior. There is circumferential narrowing of esophagus over a 3 cm segment leading up to the GE junction. There is a hiatal hernia. Lungs/Pleura: There is mild ground-glass airspace opacities in LEFT lower lobe. There branching pattern. There is a material within the LEFT lower lobe bronchi. Findings suggest aspiration pneumonitis. Similar findings in the Right upper lobe laterally. Musculoskeletal: Very little body fat the thorax. No aggressive osseous lesion. CT ABDOMEN AND PELVIS FINDINGS Hepatobiliary: No focal hepatic lesion. No biliary ductal dilatation. Gallbladder is normal. Common bile duct is normal. Pancreas: Pancreas is normal. No ductal dilatation. No pancreatic inflammation. Spleen: Normal spleen Adrenals/urinary tract: Adrenal glands and kidneys are normal. The ureters and bladder normal. Stomach/Bowel: Moderate hiatal hernia noted. Distal esophageal stricture. Examination of the peritoneal space difficult due to very little body fat high-density barium from 1 day prior. No gross abnormality of the small bowel or colon. Vascular/Lymphatic: Abdominal aorta is normal caliber with atherosclerotic calcification. There is no retroperitoneal or periportal lymphadenopathy. No pelvic lymphadenopathy.  Reproductive: Prostate is large Other: No free fluid. Musculoskeletal: No aggressive osseous lesion. IMPRESSION: Chest Impression: 1. Circumferential stricturing of the distal esophagus with high-grade obstruction. Differential would include esophageal neoplasm versus ACHALASIA. 2. Fine airspace disease in the LEFT lower lobe with endobronchial filling defects. Concern for aspiration pneumonitis Abdomen / Pelvis Impression: 1. Very little body fat  within the peritoneal space or subcutaneous tissue. 2. Evaluation of the peritoneal contents difficulty high-grade high-density barium. 3. No evidence of metastatic disease within the liver. No gross evidence of lymphadenopathy. Electronically Signed   By: Suzy Bouchard M.D.   On: 11/01/2016 14:45   Dg Ugi W/high Density W/kub  Result Date: 11/01/2016 CLINICAL DATA:  Significant weight loss over the last 3 months greater than 50 pounds. Unable to swallow solids or liquids. EXAM: UPPER GI SERIES WITH KUB TECHNIQUE: After obtaining a scout radiograph a routine upper GI series was performed using thin barium. FLUOROSCOPY TIME:  Fluoroscopy Time:  42 seconds Radiation Exposure Index (if provided by the fluoroscopic device): 3.2 mGy Number of Acquired Spot Images: 0 COMPARISON:  None. FINDINGS: KUB: There is no bowel dilatation to suggest obstruction. There is no evidence of pneumoperitoneum, portal venous gas or pneumatosis. There are no pathologic calcifications along the expected course of the ureters. The osseous structures are unremarkable. Upper GI: The patient is unable to tolerate significant amounts of contrast. During the entire examination the patient was nauseous and continued to spit up saliva as well as any ingested barium. There is a high-grade stricture of the distal esophagus measuring 2.5 cm approximately in length. A small amount of contrast traverses the stricture. The esophagus proximal to the stricture is severely dilated with secretions.  IMPRESSION: 1. High-grade stricture of the distal esophagus measuring approximately 2.5 cm in length. Only a trickle of contrast traverses the stricture. The stricture is concerning for an inflammatory or neoplastic etiology. Recommend further evaluation with a CT of the chest and abdomen given the recently failed upper endoscopy. Electronically Signed   By: Kathreen Devoid   On: 11/01/2016 12:02    EKG:   Orders placed or performed during the hospital encounter of 11/01/16  . ED EKG  . ED EKG    ASSESSMENT AND PLAN:   56 year old male with past medical history paranoid schizophrenia, COPD, hyperlipidemia, who presents to the hospital due to ongoing weight loss, dysphagia and noted to have an upper GI series concerning for a high-grade esophageal stricture concerning for neoplasm.  1. Dysphagia-patient had an upper GI series which shows a high-grade esophageal stricture worrisome for malignancy. -Weight loss, dysphagia and nausea vomiting going on severely for the last 3 months at least. -EGD done showing severe achalasia and a distal esophageal complete stricture/obstruction. Biopsies done. -Appreciate GI consult. Unable to pass packed through endoscopy currently. -Patient will need TPN and parental nutrition at this time. Percutaneous PEG tube might be attending did or he will need a J-tube. -Consulted interventional radiologist Dr. Kathlene Cote on call, because his stomach is so shrunken, and has hiatal hernia, unable to place a G-tube at this time. -Is and will be nothing by mouth, changes oral medications to other routes. -Dietitian consult for TPN  2. Acute kidney injury-secondary to dehydration. -on IV fluids  3. Hypokalemia-secondary to poor by mouth intake and dehydration. -on supplements   4. Adult Failure to thrive - due to malignancy and ongoing dysphagia.  Biopsy results in 3 days. Then may be oncology/palliative consults  5. Hyperlipidemia - cont. Crestor.   6. COPD -  no acute exacerbation.  - cont. Albuterol inhaler PRN  7. Paranoid Schizophrenia - patient is very angry, likely was not absorbing his meds for a while now Psych consulted for changing meds to other routes   8. DVT prophylaxis-on Lovenox  Spoke with his caregivers at bedside   All the records are reviewed  and case discussed with Care Management/Social Workerr. Management plans discussed with the patient, family and they are in agreement.  CODE STATUS: Full code  TOTAL TIME TAKING CARE OF THIS PATIENT: 38 minutes.   POSSIBLE D/C ? DAYS, DEPENDING ON CLINICAL CONDITION.   Gladstone Lighter M.D on 11/02/2016 at 3:47 PM  Between 7am to 6pm - Pager - [phone]  After 6pm go to [email or web address] - password [email or web address] [facility]  Office  [phone]  CC: Primary care physician; No PCP Per Patient

## 2016-11-02 NOTE — Anesthesia Post-op Follow-up Note (Cosign Needed)
Anesthesia QCDR form completed.        

## 2016-11-02 NOTE — Progress Notes (Signed)
Initial Nutrition Assessment  DOCUMENTATION CODES:   Severe malnutrition in context of chronic illness  INTERVENTION:  Recommend surgically placed G-tube with tube feeds  Monitor for refeeding; patient at HIGH risk   Monitor labs  NUTRITION DIAGNOSIS:   Malnutrition related to chronic illness, dysphagia as evidenced by severe depletion of body fat, severe depletion of muscle mass,36% percent weight loss 3 months.  GOAL:   Patient will meet greater than or equal to 90% of their needs  MONITOR:   Diet advancement, Labs, Weight trends  REASON FOR ASSESSMENT:   Malnutrition Screening Tool    ASSESSMENT:   56 year old male with past medical history paranoid schizophrenia, COPD, hyperlipidemia, who presents to the hospital due to ongoing weight loss, dysphagia and noted to have an upper GI series concerning for a high-grade esophageal stricture concerning for neoplasm.   Met with pt today. Pt seemed confused and only able to answer basic questions. Per chart, pt has not been able to eat well for some time and has lost 52lbs(36%) in 3 months. This is severe. Pt noted to have esophageal stricture and severe schizophrenia, both of which are likely contributing to his failure to thrive. Pt has had two failed EGDs. GI is following and suspicious for malignancy. Biopsies pending. Pt will likely need surgical placement of G-tube vs TPN. Would be best to use gut for feeding if possible.   Medications reviewed and include: Vit D, lovenox, protonix, KCl, carafate  Labs reviewed: Na 134(L), K 3.1(L), Cl 98(L), BUN 49(H)  Nutrition-Focused physical exam completed. Findings are severe fat depletion, severe muscle depletion, and no edema.   Diet Order:  Diet NPO time specified  Skin:  Wound (see comment) (Stage I coccyx)  Last BM:  1/19  Height:   Ht Readings from Last 1 Encounters:  11/02/16 _0  (1.778 m)    Weight:   Wt Readings from Last 1 Encounters:  11/02/16 93 lb 6.4 oz  (42.4 kg)    Ideal Body Weight:  75.4 kg  BMI:  Body mass index is 13.4 kg/m.  Estimated Nutritional Needs:   Kcal:  1500-1700kcal/day   Protein:  80-95g/day   Fluid:  >5L/day   EDUCATION NEEDS:   No education needs identified at this time  Koleen Distance, RD, LDN Pager #[phone]

## 2016-11-02 NOTE — Care Management Important Message (Signed)
Important Message  Patient Details  Name: KELIN WIKLUND MRN: [id number] Date of Birth: 1961/06/04   Medicare Important Message Given:  Yes Initial signed IM printed from Epic and given to patient.  Katrina Stack, RN 11/02/2016, 11:06 AM

## 2016-11-02 NOTE — Progress Notes (Addendum)
Patient became irate, screaming "i'm pissed off" over and over again. He states he is upset that he cannot eat and he needs to have a bowel movement. Explained to patient why he cannot eat but he just kept screaming. Offered mouth moisturizer. Offered suppository. Screamed that he needs coffee for his bowels to move. He demanded that I talk to a doctor and get him something to eat. Notified Dr. Ara Kussmaul of agitation and of low blood pressure. She ordered to give a bolus and 1mg  of ativan. Patient refused ativan. He did finally calm down some and used some mouth moisturizer and received a suppository.  Will continue to monitor.

## 2016-11-02 NOTE — Progress Notes (Addendum)
A&O. Up with assist, patient is very weak. Reports poor PO intake with a weight loss of about 50 lb's.  IV fluids infusing. NPO. Possible endoscopy today. Placed on contact precautions for positive MRSA screening. Explained contact isolation to patient and MRSA colonization. Patient expressed understanding.

## 2016-11-02 NOTE — Progress Notes (Signed)
   [place] [facility] [place] [facility] Day: 1 day Roberto Davis is a 56 y.o. male presenting with dysphagia and weight loss.   EGD showing significant esophageal stricture- biopsies taken. Also might need PEG tube, further surgeries and radiation based on the diagnosis. Patient has paranoid schizophrenia and cannot make decisions for himself.  Advance care planning discussed with patient and his caregivers from the group home at bedside who have been taking care of the patient. All questions in regards to overall condition and expected prognosis answered. The decision was made to get more information about who the legal guardian is on this patient so further decisions can be made.   So he will remain a full code.  CODE STATUS: Full code Time spent: 18 minutes

## 2016-11-02 NOTE — Care Management (Signed)
Patient is a resident of [facility] living, owned by [employer].  Has been a resident for 5 years.  Independent in all his adls. CSW referral is present.  He is admitted with acute renal failure and hyponatremia. There is concern for an esophageal mass

## 2016-11-02 NOTE — Consult Note (Signed)
[facility] Consult   Reason for Consult:  Consult for 56 year old man with a history of schizophrenia who is admitted to the hospital because of esophageal obstruction Referring Physician:  Tressia Miners Patient Identification: Roberto Davis MRN:  [id number] Principal Diagnosis: Paranoid schizophrenia [facility]) Diagnosis:   Patient Active Problem List   Diagnosis Date Noted  . Pressure injury of skin [L89.90] 11/02/2016  . Protein-calorie malnutrition, severe [E43] 11/02/2016  . Acute renal failure ([place]) [N17.9] 11/01/2016  . Paranoid schizophrenia ([place]) [F20.0]   . High cholesterol [E78.00]   . COPD (chronic obstructive pulmonary disease) ([place]) [J44.9]     Total Time spent with patient: 1 hour  Subjective:   Roberto Davis is a 56 y.o. male patient admitted with "I lost a lot of weight".  HPI:  Patient interviewed. Chart reviewed. This is a 56 year old man with schizophrenia. Brought from his group home living facility for EGD to investigate abnormal barium swallow and difficulty swallowing. Patient's chief complaint is that he has lost a great deal of weight. He seems to indicate that perhaps the weight loss is even more than would be expected from his inability to swallow but his history is a little hard to follow. He says that he's been losing weight since last summer and has been having trouble swallowing since then too. He says recently it's gotten to where he throws up pretty much everything he eats. Patient says he feels depressed because he looks so unusual. That seems to be his chief complaint around. He says otherwise his mood has been stable. Sleep has been okay. He says he has not been having any hallucinations. Totally denies suicidal or homicidal ideation. Current medications apparently are Zyprexa Zydis and Ativan at night only.    Social history: Patient is not married. He says his closest living relative is his adopted brother. He lives in a group home  and has a legal guardian.  Medical history: History of COPD, elevated cholesterol, gastric reflux symptoms. No previously identified history of cancer that I can identify.  Substance abuse history: Denies any alcohol or drug abuse anytime recently. Not clear if he might of had some of the distant past.  Past Psychiatric History: Patient has a history of schizophrenia. He has had multiple hospitalizations throughout his life but we have not seen him psychiatrically at our hospital in over 2 years. He has been stable taking medication provided by his outpatient psychiatric provider. He does have a history of some agitation when emotionally distraught in the past. Does not have a history of serious injury to others.  Risk to Self: Is patient at risk for suicide?: No Risk to Others:   Prior Inpatient Therapy:   Prior Outpatient Therapy:    Past Medical History:  Past Medical History:  Diagnosis Date  . COPD (chronic obstructive pulmonary disease) ([place])   . High cholesterol   . Paranoid schizophrenia [facility])     Past Surgical History:  Procedure Laterality Date  . DG BARIUM SWALLOW ([place] HX)    . ESOPHAGOGASTRODUODENOSCOPY (EGD) WITH PROPOFOL N/A 10/25/2016   Procedure: ESOPHAGOGASTRODUODENOSCOPY (EGD) WITH PROPOFOL;  Surgeon: Jonathon Bellows, MD;  Location: [facility] ENDOSCOPY;  Service: Endoscopy;  Laterality: N/A;  . HAND SURGERY     Family History:  Family History  Problem Relation Age of Onset  . Heart failure Mother    Family Psychiatric  History: Patient is not aware of there being a family history Social History:  History  Alcohol Use No  History  Drug Use  . Types: Marijuana, "Crack" cocaine    Comment: Used to do Crack, Cocaine (quit 9 yrs ago)    Social History   Social History  . Marital status: Single    Spouse name: N/A  . Number of children: N/A  . Years of education: N/A   Social History Main Topics  . Smoking status: Current Every Day Smoker    Packs/day: 0.25     Years: 40.00    Types: Cigarettes  . Smokeless tobacco: Never Used  . Alcohol use No  . Drug use: Yes    Types: Marijuana, "Crack" cocaine     Comment: Used to do Crack, Cocaine (quit 9 yrs ago)  . Sexual activity: Not Asked   Other Topics Concern  . None   Social History Narrative  . None   Additional Social History:    Allergies:   Allergies  Allergen Reactions  . Haldol [Haloperidol Lactate] Other (See Comments)    Shaking uncontrollable   . Ampicillin   . Depakote [Divalproex Sodium]   . Lamictal [Lamotrigine]   . Penicillins Other (See Comments)    Reaction unknown-childhood allergy    Labs:  Results for orders placed or performed during the hospital encounter of 11/01/16 (from the past 48 hour(s))  CBC with Differential/Platelet     Status: Abnormal   Collection Time: 11/01/16 12:44 PM  Result Value Ref Range   WBC 10.7 (H) 3.8 - 10.6 K/uL   RBC 5.06 4.40 - 5.90 MIL/uL   Hemoglobin 16.0 13.0 - 18.0 g/dL   HCT 59.9 43.7 - 19.0 %   MCV 89.0 80.0 - 100.0 fL   MCH 31.6 26.0 - 34.0 pg   MCHC 35.5 32.0 - 36.0 g/dL   RDW 70.7 21.7 - 11.6 %   Platelets 287 150 - 440 K/uL   Neutrophils Relative % 80 %   Neutro Abs 8.5 (H) 1.4 - 6.5 K/uL   Lymphocytes Relative 17 %   Lymphs Abs 1.8 1.0 - 3.6 K/uL   Monocytes Relative 3 %   Monocytes Absolute 0.3 0.2 - 1.0 K/uL   Eosinophils Relative 0 %   Eosinophils Absolute 0.0 0 - 0.7 K/uL   Basophils Relative 0 %   Basophils Absolute 0.0 0 - 0.1 K/uL  Comprehensive metabolic panel     Status: Abnormal   Collection Time: 11/01/16 12:44 PM  Result Value Ref Range   Sodium 134 (L) 135 - 145 mmol/L   Potassium 2.4 (LL) 3.5 - 5.1 mmol/L    Comment: CRITICAL RESULT CALLED TO, READ BACK BY AND VERIFIED WITH ASHLEY [facility] 11/02/15 1338 SGD    Chloride 93 (L) 101 - 111 mmol/L   CO2 28 22 - 32 mmol/L   Glucose, Bld 89 65 - 99 mg/dL   BUN 52 (H) 6 - 20 mg/dL   Creatinine, Ser 5.46 (H) 0.61 - 1.24 mg/dL   Calcium 9.1 8.9 - 12.4  mg/dL   Total Protein 6.4 (L) 6.5 - 8.1 g/dL   Albumin 3.6 3.5 - 5.0 g/dL   AST 18 15 - 41 U/L   ALT 15 (L) 17 - 63 U/L   Alkaline Phosphatase 64 38 - 126 U/L   Total Bilirubin 1.1 0.3 - 1.2 mg/dL   GFR calc non Af Amer 35 (L) >60 mL/min   GFR calc Af Amer 40 (L) >60 mL/min    Comment: (NOTE) The eGFR has been calculated using the CKD EPI equation. This calculation has  not been validated in all clinical situations. eGFR's persistently <60 mL/min signify possible Chronic Kidney Disease.    Anion gap 13 5 - 15  Troponin I     Status: Abnormal   Collection Time: 11/01/16 12:44 PM  Result Value Ref Range   Troponin I 0.04 (HH) <0.03 ng/mL    Comment: CRITICAL RESULT CALLED TO, READ BACK BY AND VERIFIED WITH ASHLEY SMITH 11/01/16 1344 SGD   Urinalysis, Complete w Microscopic     Status: Abnormal   Collection Time: 11/01/16  8:20 PM  Result Value Ref Range   Color, Urine YELLOW (A) YELLOW   APPearance CLEAR (A) CLEAR   Specific Gravity, Urine >1.046 (H) 1.005 - 1.030   pH 5.0 5.0 - 8.0   Glucose, UA 50 (A) NEGATIVE mg/dL   Hgb urine dipstick NEGATIVE NEGATIVE   Bilirubin Urine NEGATIVE NEGATIVE   Ketones, ur NEGATIVE NEGATIVE mg/dL   Protein, ur NEGATIVE NEGATIVE mg/dL   Nitrite NEGATIVE NEGATIVE   Leukocytes, UA NEGATIVE NEGATIVE   RBC / HPF 0-5 0 - 5 RBC/hpf   WBC, UA 0-5 0 - 5 WBC/hpf   Bacteria, UA NONE SEEN NONE SEEN   Squamous Epithelial / LPF 0-5 (A) NONE SEEN   Mucous PRESENT    Hyaline Casts, UA PRESENT   MRSA PCR Screening     Status: Abnormal   Collection Time: 11/02/16  1:32 AM  Result Value Ref Range   MRSA by PCR POSITIVE (A) NEGATIVE    Comment:        The GeneXpert MRSA Assay (FDA approved for NASAL specimens only), is one component of a comprehensive MRSA colonization surveillance program. It is not intended to diagnose MRSA infection nor to guide or monitor treatment for MRSA infections. RESULT CALLED TO, READ BACK BY AND VERIFIED WITH: CRYSTAL  BALENTINE 11/02/16 @ 0303  MLK   Basic metabolic panel     Status: Abnormal   Collection Time: 11/02/16  4:15 AM  Result Value Ref Range   Sodium 134 (L) 135 - 145 mmol/L   Potassium 3.1 (L) 3.5 - 5.1 mmol/L   Chloride 98 (L) 101 - 111 mmol/L   CO2 28 22 - 32 mmol/L   Glucose, Bld 163 (H) 65 - 99 mg/dL   BUN 49 (H) 6 - 20 mg/dL   Creatinine, Ser 1.11 0.61 - 1.24 mg/dL   Calcium 8.9 8.9 - 10.3 mg/dL   GFR calc non Af Amer >60 >60 mL/min   GFR calc Af Amer >60 >60 mL/min    Comment: (NOTE) The eGFR has been calculated using the CKD EPI equation. This calculation has not been validated in all clinical situations. eGFR's persistently <60 mL/min signify possible Chronic Kidney Disease.    Anion gap 8 5 - 15  CBC     Status: Abnormal   Collection Time: 11/02/16  4:15 AM  Result Value Ref Range   WBC 9.9 3.8 - 10.6 K/uL   RBC 4.24 (L) 4.40 - 5.90 MIL/uL   Hemoglobin 13.0 13.0 - 18.0 g/dL   HCT 37.6 (L) 40.0 - 52.0 %   MCV 88.8 80.0 - 100.0 fL   MCH 30.7 26.0 - 34.0 pg   MCHC 34.6 32.0 - 36.0 g/dL   RDW 13.3 11.5 - 14.5 %   Platelets 279 150 - 440 K/uL    Comment: PLATELET CLUMPS NOTED ON SMEAR, COUNT APPEARS ADEQUATE  Magnesium     Status: None   Collection Time: 11/02/16  4:15 AM  Result Value Ref Range   Magnesium 1.8 1.7 - 2.4 mg/dL    Current Facility-Administered Medications  Medication Dose Route Frequency Provider Last Rate Last Dose  . acetaminophen (TYLENOL) tablet 650 mg  650 mg Oral Q6H PRN Henreitta Leber, MD       Or  . acetaminophen (TYLENOL) suppository 650 mg  650 mg Rectal Q6H PRN Henreitta Leber, MD      . albuterol (PROVENTIL) (2.5 MG/3ML) 0.083% nebulizer solution 3 mL  3 mL Inhalation Q6H PRN Henreitta Leber, MD      . bisacodyl (DULCOLAX) suppository 10 mg  10 mg Rectal Daily PRN Gladstone Lighter, MD      . chlorhexidine (PERIDEX) 0.12 % solution 15 mL  15 mL Mouth Rinse BID Henreitta Leber, MD   15 mL at 11/02/16 1047  . Chlorhexidine Gluconate Cloth 2  % PADS 6 each  6 each Topical Q0600 Henreitta Leber, MD   6 each at 11/02/16 0600  . dextrose 5 % and 0.45 % NaCl with KCl 40 mEq/L infusion   Intravenous Continuous Gladstone Lighter, MD 250 mL/hr at 11/02/16 1047    . enoxaparin (LOVENOX) injection 30 mg  30 mg Subcutaneous QHS Henreitta Leber, MD   30 mg at 11/02/16 0214  . iopamidol (ISOVUE-300) 61 % injection 30 mL  30 mL Oral Once PRN Earleen Newport, MD      . LORazepam (ATIVAN) injection 1 mg  1 mg Intravenous Q6H PRN Gladstone Lighter, MD      . LORazepam (ATIVAN) injection 2 mg  2 mg Intravenous QHS Aalliyah Kilker T Farra Nikolic, MD      . magnesium sulfate IVPB 2 g 50 mL  2 g Intravenous Once Gladstone Lighter, MD      . MEDLINE mouth rinse  15 mL Mouth Rinse q12n4p Henreitta Leber, MD      . mupirocin ointment (BACTROBAN) 2 % 1 application  1 application Nasal BID Henreitta Leber, MD   1 application at 37/10/62 1047  . OLANZapine zydis (ZYPREXA) disintegrating tablet 10 mg  10 mg Oral QHS Gonzella Lex, MD      . ondansetron (ZOFRAN) tablet 4 mg  4 mg Oral Q6H PRN Henreitta Leber, MD       Or  . ondansetron (ZOFRAN) injection 4 mg  4 mg Intravenous Q6H PRN Henreitta Leber, MD      . ondansetron (ZOFRAN) tablet 4 mg  4 mg Oral Q4H PRN Henreitta Leber, MD      . phenol (CHLORASEPTIC) mouth spray 1 spray  1 spray Mouth/Throat PRN Gladstone Lighter, MD      . pneumococcal 23 valent vaccine (PNU-IMMUNE) injection 0.5 mL  0.5 mL Intramuscular Prior to discharge Henreitta Leber, MD      . sodium chloride flush (NS) 0.9 % injection 3 mL  3 mL Intravenous Q12H Henreitta Leber, MD   3 mL at 11/02/16 0045    Musculoskeletal: Strength & Muscle Tone: decreased and atrophy Gait & Station: unsteady Patient leans: N/A  Psychiatric Specialty Exam: Physical Exam  Nursing note and vitals reviewed. Constitutional: He appears cachectic. He appears distressed.  HENT:  Head: Normocephalic and atraumatic.  Eyes: Conjunctivae are normal. Pupils are equal,  round, and reactive to light.  Neck: Normal range of motion.  Cardiovascular: Regular rhythm.   Respiratory: Effort normal. No respiratory distress.  GI: Soft.  Musculoskeletal: Normal range of motion.  Neurological: He is alert.  Skin: Skin is warm and dry.  Psychiatric: His speech is normal and behavior is normal. His affect is blunt. Thought content is not delusional. Cognition and memory are impaired. He expresses impulsivity. He expresses no homicidal and no suicidal ideation.    Review of Systems  Constitutional: Positive for malaise/fatigue and weight loss.  HENT: Negative.   Eyes: Negative.   Respiratory: Negative.   Cardiovascular: Negative.   Gastrointestinal: Positive for nausea and vomiting.  Musculoskeletal: Negative.   Skin: Negative.   Neurological: Positive for weakness.  Psychiatric/Behavioral: Positive for depression. Negative for hallucinations, memory loss, substance abuse and suicidal ideas. The patient is nervous/anxious. The patient does not have insomnia.     Blood pressure (!) 89/65, pulse (!) 55, temperature 97.5 F (36.4 C), resp. rate 20, height '5\' 10"'$  (1.778 m), weight 42.4 kg (93 lb 6.4 oz), SpO2 100 %.Body mass index is 13.4 kg/m.  General Appearance: Disheveled  Eye Contact:  Fair  Speech:  Slow  Volume:  Decreased  Mood:  Dysphoric  Affect:  Blunt and Congruent  Thought Process:  Disorganized  Orientation:  Full (Time, Place, and Person)  Thought Content:  Rumination and Tangential  Suicidal Thoughts:  No  Homicidal Thoughts:  No  Memory:  Immediate;   Good Recent;   Fair Remote;   Fair  Judgement:  Impaired  Insight:  Fair  Psychomotor Activity:  Decreased  Concentration:  Concentration: Fair  Recall:  [employer] of Knowledge:  Fair  Language:  Fair  Akathisia:  No  Handed:  Right  AIMS (if indicated):     Assets:  Desire for Improvement Housing Resilience Social Support  ADL's:  Impaired  Cognition:  Impaired,  Mild  Sleep:         Treatment Plan Summary: Daily contact with patient to assess and evaluate symptoms and progress in treatment, Medication management and Plan This is a 56 year old man with schizophrenia who is in the hospital for weight loss and esophageal obstruction. As I understand it there is a blockage in his lower esophagus. Concern has been raised for possible malignancy. Further studies will be needed. It appears at this point that it would be impossible for him to get adequate nutrition by mouth. He may require PEG tube and/or parenteral nutrition. I explained all of this to the patient. At first he told me that he would "resist" having a PEG tube. I then commonly made it as clear as I could that if he does not get nutrition into his body he is going to start to death. This point he seemed quite willing to change his mind. As far as his medication the dissolvable situs usually can still be administered as long as a person is able to manage their own saliva area I will continue the 10 mg dissolvable Zyprexa therefore. Usually this only needs to be stopped if the patient is on GI suction. The Ativan can be administered intravenously. As far as capacity, patients with schizophrenia can be something of a challenge. He most likely has the cognitive ability to comprehend but any proposed procedures will have to be presented to him in the most concrete straightforward manner with communication of worst case consequences if he refuses emphasized and at the same time this should be done with a minimum of emotional drama. I will follow-up with him while he is in the hospital. additionally, I believe he has a legal guardian. This should probably be confirmed because if that is the case that  person would have to give consent for surgical treatment.  Disposition: Patient does not meet criteria for psychiatric inpatient admission. Supportive therapy provided about ongoing stressors.  Alethia Berthold, MD 11/02/2016 6:03 PM

## 2016-11-02 NOTE — OR Nursing (Signed)
PROCEDURE INTERRUPTED TO INTUBATE. SCOPE REMOVED

## 2016-11-02 NOTE — Anesthesia Postprocedure Evaluation (Signed)
Anesthesia Post Note  Patient: Roberto Davis  Procedure(s) Performed: Procedure(s) (LRB): ESOPHAGOGASTRODUODENOSCOPY (EGD) WITH PROPOFOL (N/A)  Patient location during evaluation: Endoscopy Anesthesia Type: General Level of consciousness: awake and alert Pain management: pain level controlled Vital Signs Assessment: post-procedure vital signs reviewed and stable Respiratory status: spontaneous breathing, nonlabored ventilation, respiratory function stable and patient connected to nasal cannula oxygen Cardiovascular status: blood pressure returned to baseline and stable Postop Assessment: no signs of nausea or vomiting Anesthetic complications: no     Last Vitals:  Vitals:   11/02/16 1334 11/02/16 1350  BP: 95/70 (!) 89/65  Pulse: (!) 50 (!) 55  Resp: 20   Temp: 36.4 C     Last Pain:  Vitals:   11/02/16 1158  TempSrc: Tympanic  PainSc:                  [name] S

## 2016-11-02 NOTE — H&P (Signed)
Jonathon Bellows MD [address], [place], [place] [address] Phone: [phone] Fax : [phone]  Primary Care Physician:  No PCP Per Patient Primary Gastroenterologist:  Dr. Jonathon Bellows   Pre-Procedure History & Physical: HPI:  Roberto Davis is a 56 y.o. male is here for an endoscopy.He underwent an EGD last week but was aborted due to large qty of food seen in the esophagus. He subsequently underwent an UGI series which revealed a possible stricture in the lower end of the esophagus. He has lost a lot of weight recently and so far we believe that his psychiatric illness is thd riving force for not eating adequately.    Past Medical History:  Diagnosis Date  . COPD (chronic obstructive pulmonary disease) ([place])   . High cholesterol   . Paranoid schizophrenia [facility])     Past Surgical History:  Procedure Laterality Date  . DG BARIUM SWALLOW ([place] HX)    . ESOPHAGOGASTRODUODENOSCOPY (EGD) WITH PROPOFOL N/A 10/25/2016   Procedure: ESOPHAGOGASTRODUODENOSCOPY (EGD) WITH PROPOFOL;  Surgeon: Jonathon Bellows, MD;  Location: [facility] ENDOSCOPY;  Service: Endoscopy;  Laterality: N/A;  . HAND SURGERY      Prior to Admission medications   Medication Sig Start Date End Date Taking? Authorizing Provider  aspirin EC 81 MG tablet Take 81 mg by mouth daily.   Yes Historical Provider, MD  benztropine (COGENTIN) 1 MG tablet Take 1 mg by mouth daily.  05/25/13  Yes Historical Provider, MD  Cholecalciferol (VITAMIN D3) 5000 units CAPS Take 5,000 Units by mouth daily.   Yes Historical Provider, MD  ibuprofen (ADVIL,MOTRIN) 400 MG tablet Take 400 mg by mouth 3 (three) times daily as needed for pain or fever (for headache).   Yes Historical Provider, MD  LORazepam (ATIVAN) 2 MG tablet Take 2 mg by mouth at bedtime. And prn   Yes Historical Provider, MD  OLANZapine zydis (ZYPREXA) 10 MG disintegrating tablet Take 10 mg by mouth at bedtime.   Yes Historical Provider, MD  ondansetron (ZOFRAN) 4 MG tablet Take  4 mg by mouth as needed for nausea or vomiting. q 4-6 h   Yes Historical Provider, MD  pantoprazole (PROTONIX) 40 MG tablet Take 40 mg by mouth 2 (two) times daily.    Yes Historical Provider, MD  rosuvastatin (CRESTOR) 20 MG tablet Take 20 mg by mouth at bedtime.   Yes Historical Provider, MD  sucralfate (CARAFATE) 1 g tablet Take 1 g by mouth 4 (four) times daily.   Yes Historical Provider, MD  traMADol (ULTRAM) 50 MG tablet Take 1 tablet (50 mg total) by mouth every 6 (six) hours as needed. 07/18/16  Yes Milton Ferguson, MD  triamcinolone cream (KENALOG) 0.1 % Apply 1 application topically 3 (three) times daily as needed (for healing).   Yes Historical Provider, MD  TRILIPIX 135 MG capsule Take 135 mg by mouth daily.  06/23/13  Yes Historical Provider, MD  VENTOLIN HFA 108 (90 BASE) MCG/ACT inhaler Inhale 1 puff into the lungs every 6 (six) hours as needed.  05/21/13  Yes Historical Provider, MD  ZETIA 10 MG tablet Take 10 mg by mouth daily.  06/23/13  Yes Historical Provider, MD    Allergies as of 11/01/2016 - Review Complete 11/01/2016  Allergen Reaction Noted  . Haldol [haloperidol lactate] Other (See Comments) 07/04/2013  . Ampicillin  10/25/2016  . Depakote [divalproex sodium]  10/25/2016  . Lamictal [lamotrigine]  10/25/2016  . Penicillins Other (See Comments) 07/04/2013    Family History  Problem  Relation Age of Onset  . Heart failure Mother     Social History   Social History  . Marital status: Single    Spouse name: N/A  . Number of children: N/A  . Years of education: N/A   Occupational History  . Not on file.   Social History Main Topics  . Smoking status: Current Every Day Smoker    Packs/day: 0.25    Years: 40.00    Types: Cigarettes  . Smokeless tobacco: Never Used  . Alcohol use No  . Drug use: Yes    Types: Marijuana, "Crack" cocaine     Comment: Used to do Crack, Cocaine (quit 9 yrs ago)  . Sexual activity: Not on file   Other Topics Concern  . Not on file     Social History Narrative  . No narrative on file    Review of Systems: See HPI, otherwise negative ROS  Physical Exam: BP (!) 89/62   Pulse (!) 55   Temp 97.8 F (36.6 C) (Tympanic)   Resp 16   Ht 5\' 10"  (1.778 m)   Wt 93 lb 6.4 oz (42.4 kg)   SpO2 99%   BMI 13.40 kg/m  General:   Alert,  pleasant and cooperative in NAD Head:  Normocephalic and atraumatic. Neck:  Supple; no masses or thyromegaly. Lungs:  Clear throughout to auscultation.    Heart:  Regular rate and rhythm. Abdomen:  Soft, nontender and nondistended. Normal bowel sounds, without guarding, and without rebound.   Neurologic:  Alert and  oriented x4;  grossly normal neurologically.  Impression/Plan: Roberto Davis is here for an endoscopy to be performed for dysphagia and abnormal findings on UGI series   Risks, benefits, limitations, and alternatives regarding  endoscopy and and possible dilation  have been reviewed with the patient.  Questions have been answered.  All parties agreeable.   Jonathon Bellows, MD  11/02/2016, 12:11 PM

## 2016-11-02 NOTE — Transfer of Care (Signed)
Immediate Anesthesia Transfer of Care Note  Patient: Roberto Davis  Procedure(s) Performed: Procedure(s): ESOPHAGOGASTRODUODENOSCOPY (EGD) WITH PROPOFOL (N/A)  Patient Location: PACU  Anesthesia Type:General  Level of Consciousness: awake, alert  and oriented  Airway & Oxygen Therapy: Patient Spontanous Breathing and Patient connected to face mask oxygen  Post-op Assessment: Report given to RN and Post -op Vital signs reviewed and stable  Post vital signs: Reviewed and stable  Last Vitals:  Vitals:   11/02/16 1158 11/02/16 1305  BP: (!) 89/62 90/60  Pulse: (!) 55   Resp: 16   Temp: 36.6 C 36.4 C    Last Pain:  Vitals:   11/02/16 1158  TempSrc: Tympanic  PainSc:          Complications: No apparent anesthesia complications

## 2016-11-02 NOTE — Anesthesia Procedure Notes (Signed)
Procedure Name: Intubation Date/Time: 11/02/2016 12:33 PM Performed by: Jonna Clark Pre-anesthesia Checklist: Patient identified, Patient being monitored, Timeout performed, Emergency Drugs available and Suction available Patient Re-evaluated:Patient Re-evaluated prior to inductionOxygen Delivery Method: Circle system utilized Preoxygenation: Pre-oxygenation with 100% oxygen Intubation Type: IV induction Ventilation: Mask ventilation without difficulty Laryngoscope Size: Mac and 3 Grade View: Grade I Tube type: Oral Tube size: 7.0 mm Number of attempts: 1 Airway Equipment and Method: Bougie stylet Placement Confirmation: ETT inserted through vocal cords under direct vision,  positive ETCO2 and breath sounds checked- equal and bilateral Secured at: 21 cm Tube secured with: Tape Dental Injury: Teeth and Oropharynx as per pre-operative assessment  Future Recommendations: Recommend- induction with short-acting agent, and alternative techniques readily available

## 2016-11-02 NOTE — Anesthesia Preprocedure Evaluation (Addendum)
Anesthesia Evaluation  Patient identified by MRN, date of birth, ID band Patient awake    Reviewed: Allergy & Precautions, H&P , NPO status , Patient's Chart, lab work & pertinent test results  History of Anesthesia Complications Negative for: history of anesthetic complications  Airway Mallampati: II  TM Distance: >3 FB Neck ROM: full    Dental  (+) Poor Dentition, Missing, Chipped   Pulmonary neg shortness of breath, COPD, Current Smoker,    Pulmonary exam normal breath sounds clear to auscultation       Cardiovascular Exercise Tolerance: Good (-) angina(-) Past MI and (-) DOE negative cardio ROS Normal cardiovascular exam Rhythm:regular Rate:Normal     Neuro/Psych PSYCHIATRIC DISORDERS Schizophrenia negative neurological ROS     GI/Hepatic Neg liver ROS, GERD  Controlled,  Endo/Other  negative endocrine ROS  Renal/GU Renal disease  negative genitourinary   Musculoskeletal   Abdominal   Peds  Hematology negative hematology ROS (+)   Anesthesia Other Findings Patient is NPO appropriate and reports no nausea or vomiting today.  Past Medical History: No date: COPD (chronic obstructive pulmonary disease) (* No date: High cholesterol No date: Paranoid schizophrenia ([place])  Past Surgical History: No date: DG BARIUM SWALLOW ([facility] HX) 10/25/2016: ESOPHAGOGASTRODUODENOSCOPY (EGD) WITH PROPOFOL N/A     Comment: Procedure: ESOPHAGOGASTRODUODENOSCOPY (EGD)               WITH PROPOFOL;  Surgeon: Jonathon Bellows, MD;                Location: [facility] ENDOSCOPY;  Service: Endoscopy;               Laterality: N/A; No date: HAND SURGERY  BMI    Body Mass Index:  13.40 kg/m      Reproductive/Obstetrics negative OB ROS                            Anesthesia Physical Anesthesia Plan  ASA: III  Anesthesia Plan: General   Post-op Pain Management:    Induction:   Airway Management Planned:    Additional Equipment:   Intra-op Plan:   Post-operative Plan:   Informed Consent: I have reviewed the patients History and Physical, chart, labs and discussed the procedure including the risks, benefits and alternatives for the proposed anesthesia with the patient or authorized representative who has indicated his/her understanding and acceptance.   Dental Advisory Given  Plan Discussed with: Anesthesiologist, CRNA and Surgeon  Anesthesia Plan Comments: (By report, patient signs his own consents, he endorses this and this is reflected in his past medical consents.  By report, he does not have an active medical power of attorney.)       Anesthesia Quick Evaluation

## 2016-11-02 NOTE — OR Nursing (Signed)
UPPER SCOPE REINSERTED AT [address] AFTER INTUBATION BY ANESTHESIA

## 2016-11-02 NOTE — Op Note (Signed)
[facility] Gastroenterology Patient Name: Roberto Davis Procedure Date: 11/02/2016 12:21 PM MRN: [id number] Account #: [id number] Date of Birth: 1961/06/12 Admit Type: Inpatient Age: 56 Room: [facility] ENDO ROOM 4 Gender: Male Note Status: Finalized Procedure:            Upper GI endoscopy Indications:          Abnormal UGI series Providers:            Jonathon Bellows MD, MD Referring MD:         No Local Md, MD (Referring MD) Medicines:            General Anesthesia Complications:        No immediate complications. Procedure:            Pre-Anesthesia Assessment:                       - Prior to the procedure, a History and Physical was                        performed, and patient medications, allergies and                        sensitivities were reviewed. The patient's tolerance of                        previous anesthesia was reviewed.                       - The risks and benefits of the procedure and the                        sedation options and risks were discussed with the                        patient. All questions were answered and informed                        consent was obtained.                       - The risks and benefits of the procedure and the                        sedation options and risks were discussed with the                        patient. All questions were answered and informed                        consent was obtained.                       - ASA Grade Assessment: III - A patient with severe                        systemic disease.                       - After reviewing the risks and benefits, the patient  was deemed in satisfactory condition to undergo the                        procedure.                       After obtaining informed consent, the endoscope was                        passed under direct vision. Throughout the procedure,                        the patient's blood pressure, pulse, and  oxygen                        saturations were monitored continuously. The Endoscope                        was introduced through the mouth, with the intention of                        advancing to the esophagus. The scope was advanced to                        the lower third of the esophagus before the procedure                        was aborted. Medications were given. The upper GI                        endoscopy was somewhat difficult due to presence of                        food. Successful completion of the procedure was aided                        by performing the maneuvers documented (below) in this                        report. The patient tolerated the procedure well. The                        procedure was aborted due to stenosis. Performing the                        maneuvers documented (below) in this report did not                        allow for the successful completion of the procedure. Findings:      Food was found in the lower third of the esophagus. Removal of food was       accomplished. some soft food seen at the lower end of the esophagus       which was removed using a roth net      at the lower end of the esophagus at 38 cm mark. The lumen was       completely occuluded. We washed and suctioned and attempted to pass       through for over 5 minutes, theres was absolutely no peristalisis and no  lumen visible. The entire lumen of the esophagus was significantly       dilated. No reflux seen coming from the stomach . Biopsies were taken       with a cold forceps for histology. Impression:           - The procedure was aborted due to stenosis.                       - Food in the lower third of the esophagus. Removal was                        successful.                       - Esophageal stenosis. Biopsied. Recommendation:       - Return patient to hospital ward for ongoing care.                       - NPO for 2 days.                       - 1.  Follow up biopsy specimens. 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 I am                        suspicious for malignancy                       2. For nutrition our options are a radiologically                        placed PEG tube. It cannot be placed endoscopically as                        there is no lumen visible to enter the stomach . If PEG                        tube cannot be placed radiologically then will need TPN                        . \                       3. If biopsies confirm neoplasm then will need possible                        surgical intervention after an EUS.                       4. As of now - keep NPO, IV fluids. Procedure Code(s):    --- Professional ---                       [phone], 43, Esophagoscopy, flexible, transoral; with  removal of foreign body(s)                       43202, 52, Esophagoscopy, flexible, transoral; with                        biopsy, single or multiple Diagnosis Code(s):    --- Professional ---                       Z53.8, Procedure and treatment not carried out for                        other reasons                       T18.128A, Food in esophagus causing other injury,                        initial encounter                       K22.2, Esophageal obstruction                       R93.3, Abnormal findings on diagnostic imaging of other                        parts of digestive tract CPT copyright 2016 American Medical Association. All rights reserved. The codes documented in this report are preliminary and upon coder review may  be revised to meet current compliance requirements. Jonathon Bellows, MD Jonathon Bellows MD, MD 11/02/2016 1:01:19 PM This report has been signed electronically. Number of Addenda: 0 Note Initiated On: 11/02/2016 12:21 PM      [facility]

## 2016-11-03 ENCOUNTER — Inpatient Hospital Stay: Payer: Medicare Other

## 2016-11-03 LAB — BASIC METABOLIC PANEL
ANION GAP: 5 (ref 5–15)
BUN: 25 mg/dL — AB (ref 6–20)
CO2: 24 mmol/L (ref 22–32)
Calcium: 8.6 mg/dL — ABNORMAL LOW (ref 8.9–10.3)
Chloride: 107 mmol/L (ref 101–111)
Creatinine, Ser: 0.61 mg/dL (ref 0.61–1.24)
GFR calc Af Amer: >60 mL/min (ref >60)
GLUCOSE: 86 mg/dL (ref 65–99)
Potassium: 3.9 mmol/L (ref 3.5–5.1)
Sodium: 136 mmol/L (ref 135–145)

## 2016-11-03 LAB — PHOSPHORUS
PHOSPHORUS: 2.5 mg/dL (ref 2.5–4.6)
Phosphorus: 1.3 mg/dL — ABNORMAL LOW (ref 2.5–4.6)

## 2016-11-03 LAB — POTASSIUM: Potassium: 4.3 mmol/L (ref 3.5–5.1)

## 2016-11-03 LAB — HEPATIC FUNCTION PANEL
ALBUMIN: 3 g/dL — AB (ref 3.5–5.0)
ALT: 13 U/L — AB (ref 17–63)
AST: 16 U/L (ref 15–41)
Alkaline Phosphatase: 53 U/L (ref 38–126)
Bilirubin, Direct: 0.2 mg/dL (ref 0.1–0.5)
Indirect Bilirubin: 0.7 mg/dL (ref 0.3–0.9)
Total Bilirubin: 0.9 mg/dL (ref 0.3–1.2)
Total Protein: 5.4 g/dL — ABNORMAL LOW (ref 6.5–8.1)

## 2016-11-03 LAB — MAGNESIUM: Magnesium: 1.7 mg/dL (ref 1.7–2.4)

## 2016-11-03 MED ORDER — SODIUM CHLORIDE 0.9% FLUSH
10.0000 mL | Freq: Two times a day (BID) | INTRAVENOUS | Status: DC
  Administered 2016-11-04 – 2016-11-06 (×5): 10 mL
  Administered 2016-11-07: 11:00:00 13 mL

## 2016-11-03 MED ORDER — MAGNESIUM SULFATE 2 GM/50ML IV SOLN
2.0000 g | Freq: Once | INTRAVENOUS | Status: AC
  Administered 2016-11-03: 2 g via INTRAVENOUS
  Filled 2016-11-03: qty 50

## 2016-11-03 MED ORDER — TRACE MINERALS CR-CU-MN-SE-ZN 10-1000-500-60 MCG/ML IV SOLN
INTRAVENOUS | Status: AC
  Administered 2016-11-03: 20:00:00 via INTRAVENOUS
  Filled 2016-11-03: qty 960

## 2016-11-03 MED ORDER — POTASSIUM PHOSPHATES 15 MMOLE/5ML IV SOLN
30.0000 mmol | Freq: Once | INTRAVENOUS | Status: AC
  Administered 2016-11-03: 30 mmol via INTRAVENOUS
  Filled 2016-11-03: qty 10

## 2016-11-03 MED ORDER — SODIUM CHLORIDE 0.9% FLUSH: 10.0000 mL | INTRAVENOUS | Status: DC | PRN

## 2016-11-03 NOTE — Progress Notes (Addendum)
[place] NOTE   Pharmacy Consult for TPN  Indication: failure to thrive  Patient Measurements: Height: 5\' 10"  (177.8 cm) Weight: 99 lb 8 oz (45.1 kg) IBW/kg (Calculated) : 73 TPN AdjBW (KG): 42.4 Body mass index is 14.28 kg/m.  Assessment:  55yoM with a hx of COPD and paranoid schizophrenia. Patient has severe cachexia, failure to thrive, and esophageal obstruction.    Insulin requirements in the past 24 hours:  Lytes: K=3.9; Phos=1.3; Mg=1.7 Renal: Scr=0.61   Best Practices: TPN Access: PICC line is suppose to be placed today.  TPN start date: 11/03/16  Plan:  Ordered daily weights, strict intake and output, BMP, Mg, Phos, TG, and CBG Q6h.   Initiate Clinimix 5/15 w/out electrolytes at 97ml/hr.  Added MVI and trace elements in TPN  1/20 2139 K 4.3, phos 2.5 after receiving KPhos 30 mmol IV x 1 today. Will recheck electrolytes tomorrow with AM labs.   1/21 0419 K 4, Mg 1.6, phos 1.7, Ca 8.2, adjusted Ca 9. Potassium phosphate 30 mmol IV x 1, magnesium sulfate 2 gm IV x 1, recheck electrolytes this evening.   Laural Benes, PharmD, BCPS Clinical Pharmacist 11/03/2016,11:00 PM

## 2016-11-03 NOTE — Progress Notes (Signed)
Patient is refusing for a PICC to be inserted , DR Posey Pronto was informed and stated to call pt's POA to obtain a consent for the PICC insertion, voice massage to left of POA phone to cal [place] back so we can get the consent .

## 2016-11-03 NOTE — Progress Notes (Signed)
Spoke with patient to try and explain the reason for needing a central line and tpn. Patient addiment that he does not want or need the medication. Left a message for lawanda poa to make aware of situation and to have consent signed per mds request. Also made dr. patel aware even if consent is signed patient currently will not allow central line. Waiting for Lawanda to try and talk with patient.

## 2016-11-03 NOTE — Progress Notes (Addendum)
friends at bedside. Patient currently agreed to have picc inserted.consent signed, vascular called

## 2016-11-03 NOTE — Progress Notes (Signed)
[place] NOTE   Pharmacy Consult for TPN  Indication: failure to thrive  Patient Measurements: Height: 5\' 10"  (177.8 cm) Weight: 93 lb 6.4 oz (42.4 kg) IBW/kg (Calculated) : 73 TPN AdjBW (KG): 42.4 Body mass index is 13.4 kg/m.  Assessment:  55yoM with a hx of COPD and paranoid schizophrenia. Patient has severe cachexia, failure to thrive, and esophageal obstruction.    Insulin requirements in the past 24 hours:  Lytes: K=3.9; Phos=1.3; Mg=1.7 Renal: Scr=0.61   Best Practices: TPN Access: PICC line is suppose to be placed today.  TPN start date: 11/03/16  Plan:  Ordered daily weights, strict intake and output, BMP, Mg, Phos, TG, and CBG Q6h.   Initiate Clinimix 5/15 w/out electrolytes at 2ml/hr.  Added MVI and trace elements in TPN   Loree Fee, PharmD 11/03/2016,11:08 AM

## 2016-11-03 NOTE — Progress Notes (Signed)
[place] [facility] [place] NAME: Roberto Davis    MR#:  [id number]  DATE OF BIRTH:  10/21/1960  SUBJECTIVE:  CHIEF COMPLAINT:  No chief complaint on file.  - Patient admitted with weight loss and dysphagia. EGD done  showing distal esophageal stricture and dilatation of esophagus proximal to that. -Recommended nothing by mouth and parenteral nutrition at this time. -Patient has psychiatric disorder. Very insistent about having coffee and needing a laxative for bowel movement. -Patient repeatedly tells me he wants to get out and go home. He does not have insight into his problem. Tried to explain however does not seem to understand.  REVIEW OF SYSTEMS:  Review of Systems  Unable to perform ROS: Psychiatric disorder    DRUG ALLERGIES:   Allergies  Allergen Reactions  . Haldol [Haloperidol Lactate] Other (See Comments)    Shaking uncontrollable   . Ampicillin   . Depakote [Divalproex Sodium]   . Lamictal [Lamotrigine]   . Penicillins Other (See Comments)    Reaction unknown-childhood allergy    VITALS:  Blood pressure (!) 90/56, pulse (!) 55, temperature 98.5 F (36.9 C), temperature source Oral, resp. rate 18, height 5\' 10"  (1.778 m), weight 42.4 kg (93 lb 6.4 oz), SpO2 100 %.  PHYSICAL EXAMINATION:  Physical Exam  GENERAL:  56 y.o.-year-old severely malnourished patient lying in the bed with no acute distress.  EYES: Pupils equal, round, reactive to light and accommodation. No scleral icterus. Extraocular muscles intact.  HEENT: Head atraumatic, normocephalic. Oropharynx and nasopharynx clear.  NECK:  Supple, no jugular venous distention. No thyroid enlargement, no tenderness.  LUNGS: Normal breath sounds bilaterally, no wheezing, rales,rhonchi or crepitation. No use of accessory muscles of respiration.  CARDIOVASCULAR: S1, S2 normal. No murmurs, rubs, or gallops.  ABDOMEN: Soft, nontender, nondistended. Bowel sounds present. No  organomegaly or mass.  EXTREMITIES: No pedal edema, cyanosis, or clubbing.  NEUROLOGIC: Cranial nerves II through XII are intact. Able to Move extremities in bed. Sensation intact, not following commands because he is very irritable PSYCHIATRIC: The patient is alert and oriented. Very angry  SKIN: No obvious rash, lesion, or ulcer.    LABORATORY PANEL:   CBC  Recent Labs Lab 11/02/16 0415  WBC 9.9  HGB 13.0  HCT 37.6*  PLT 279   ------------------------------------------------------------------------------------------------------------------  Chemistries   Recent Labs Lab 11/03/16 0601  NA 136  K 3.9  CL 107  CO2 24  GLUCOSE 86  BUN 25*  CREATININE 0.61  CALCIUM 8.6*  MG 1.7  AST 16  ALT 13*  ALKPHOS 53  BILITOT 0.9   ------------------------------------------------------------------------------------------------------------------  Cardiac Enzymes  Recent Labs Lab 11/01/16 1244  TROPONINI 0.04*   ------------------------------------------------------------------------------------------------------------------  RADIOLOGY:  Ct Chest W Contrast  Result Date: 11/01/2016 CLINICAL DATA:  Distal esophageal stricture on esophagram. Weight loss EXAM: CT CHEST, ABDOMEN, AND PELVIS WITH CONTRAST TECHNIQUE: Multidetector CT imaging of the chest, abdomen and pelvis was performed following the standard protocol during bolus administration of intravenous contrast. CONTRAST:  37mL ISOVUE-300 IOPAMIDOL (ISOVUE-300) INJECTION 61%, 1 ISOVUE-300 IOPAMIDOL (ISOVUE-300) INJECTION 61% COMPARISON:  Esophagram 11/01/2016 FINDINGS: CT CHEST FINDINGS Cardiovascular: No significant vascular findings. Normal heart size. No pericardial effusion. Mediastinum/Nodes: No axillary or supraclavicular adenopathy. The distal esophagus is patulous and distended to 5.7 cm. There is some retained barium from esophagram 1 day prior. There is circumferential narrowing of esophagus over a 3 cm segment  leading up to the GE junction. There is a hiatal hernia.  Lungs/Pleura: There is mild ground-glass airspace opacities in LEFT lower lobe. There branching pattern. There is a material within the LEFT lower lobe bronchi. Findings suggest aspiration pneumonitis. Similar findings in the Right upper lobe laterally. Musculoskeletal: Very little body fat the thorax. No aggressive osseous lesion. CT ABDOMEN AND PELVIS FINDINGS Hepatobiliary: No focal hepatic lesion. No biliary ductal dilatation. Gallbladder is normal. Common bile duct is normal. Pancreas: Pancreas is normal. No ductal dilatation. No pancreatic inflammation. Spleen: Normal spleen Adrenals/urinary tract: Adrenal glands and kidneys are normal. The ureters and bladder normal. Stomach/Bowel: Moderate hiatal hernia noted. Distal esophageal stricture. Examination of the peritoneal space difficult due to very little body fat high-density barium from 1 day prior. No gross abnormality of the small bowel or colon. Vascular/Lymphatic: Abdominal aorta is normal caliber with atherosclerotic calcification. There is no retroperitoneal or periportal lymphadenopathy. No pelvic lymphadenopathy. Reproductive: Prostate is large Other: No free fluid. Musculoskeletal: No aggressive osseous lesion. IMPRESSION: Chest Impression: 1. Circumferential stricturing of the distal esophagus with high-grade obstruction. Differential would include esophageal neoplasm versus ACHALASIA. 2. Fine airspace disease in the LEFT lower lobe with endobronchial filling defects. Concern for aspiration pneumonitis Abdomen / Pelvis Impression: 1. Very little body fat within the peritoneal space or subcutaneous tissue. 2. Evaluation of the peritoneal contents difficulty high-grade high-density barium. 3. No evidence of metastatic disease within the liver. No gross evidence of lymphadenopathy. Electronically Signed   By: Suzy Bouchard M.D.   On: 11/01/2016 14:45   Ct Abdomen Pelvis W Contrast  Result  Date: 11/01/2016 CLINICAL DATA:  Distal esophageal stricture on esophagram. Weight loss EXAM: CT CHEST, ABDOMEN, AND PELVIS WITH CONTRAST TECHNIQUE: Multidetector CT imaging of the chest, abdomen and pelvis was performed following the standard protocol during bolus administration of intravenous contrast. CONTRAST:  10mL ISOVUE-300 IOPAMIDOL (ISOVUE-300) INJECTION 61%, 1 ISOVUE-300 IOPAMIDOL (ISOVUE-300) INJECTION 61% COMPARISON:  Esophagram 11/01/2016 FINDINGS: CT CHEST FINDINGS Cardiovascular: No significant vascular findings. Normal heart size. No pericardial effusion. Mediastinum/Nodes: No axillary or supraclavicular adenopathy. The distal esophagus is patulous and distended to 5.7 cm. There is some retained barium from esophagram 1 day prior. There is circumferential narrowing of esophagus over a 3 cm segment leading up to the GE junction. There is a hiatal hernia. Lungs/Pleura: There is mild ground-glass airspace opacities in LEFT lower lobe. There branching pattern. There is a material within the LEFT lower lobe bronchi. Findings suggest aspiration pneumonitis. Similar findings in the Right upper lobe laterally. Musculoskeletal: Very little body fat the thorax. No aggressive osseous lesion. CT ABDOMEN AND PELVIS FINDINGS Hepatobiliary: No focal hepatic lesion. No biliary ductal dilatation. Gallbladder is normal. Common bile duct is normal. Pancreas: Pancreas is normal. No ductal dilatation. No pancreatic inflammation. Spleen: Normal spleen Adrenals/urinary tract: Adrenal glands and kidneys are normal. The ureters and bladder normal. Stomach/Bowel: Moderate hiatal hernia noted. Distal esophageal stricture. Examination of the peritoneal space difficult due to very little body fat high-density barium from 1 day prior. No gross abnormality of the small bowel or colon. Vascular/Lymphatic: Abdominal aorta is normal caliber with atherosclerotic calcification. There is no retroperitoneal or periportal lymphadenopathy.  No pelvic lymphadenopathy. Reproductive: Prostate is large Other: No free fluid. Musculoskeletal: No aggressive osseous lesion. IMPRESSION: Chest Impression: 1. Circumferential stricturing of the distal esophagus with high-grade obstruction. Differential would include esophageal neoplasm versus ACHALASIA. 2. Fine airspace disease in the LEFT lower lobe with endobronchial filling defects. Concern for aspiration pneumonitis Abdomen / Pelvis Impression: 1. Very little body fat within the peritoneal space or  subcutaneous tissue. 2. Evaluation of the peritoneal contents difficulty high-grade high-density barium. 3. No evidence of metastatic disease within the liver. No gross evidence of lymphadenopathy. Electronically Signed   By: Suzy Bouchard M.D.   On: 11/01/2016 14:45    EKG:   Orders placed or performed during the hospital encounter of 11/01/16  . ED EKG  . ED EKG    ASSESSMENT AND PLAN:   56 year old male with past medical history paranoid schizophrenia, COPD, hyperlipidemia, who presents to the hospital due to ongoing weight loss, dysphagia and noted to have an upper GI series concerning for a high-grade esophageal stricture concerning for neoplasm.  1. Dysphagia-patient had an upper GI series which shows a high-grade esophageal stricture worrisome for malignancy. -Weight loss, dysphagia and nausea vomiting going on severely for the last 3 months at least. -EGD done showing severe achalasia and a distal esophageal complete stricture/obstruction. Biopsies done. -Appreciate GI consult. Unable to pass packed through endoscopy currently. -Patient will need TPN and parental nutrition at this time. Percutaneous PEG tube might be attending did or he will need a J-tube. -Consulted interventional radiologist Dr. Kathlene Cote on call, because his stomach is so shrunken, and has hiatal hernia, unable to place a G-tube at this time. -Is and will be nothing by mouth, changes oral medications to other  routes. -Dietitian consult for TPN -Discussed with surgery who recommends patient be transferred to tertiary care center given complexity of the case and unable to handle here. This was discussed with Dr. Azalee Course -A long discussion with patient's healthcare power of attorney Jaynie Bream ray and patient has [name] also listed as healthcare power of attorney per Ms. Ray. Ms ray is agreeable to patient be transferred to any tertiary care center in the area  2. Acute kidney injury-secondary to dehydration. -on IV fluids  3. Hypokalemia-secondary to poor by mouth intake and dehydration. -on supplements   4. Adult Failure to thrive - due to possible malignancy and ongoing dysphagia.  Biopsy results in 3 days. Then may be oncology/palliative consults  5. Hyperlipidemia - cont. Crestor.   6. COPD - no acute exacerbation.  - cont. Albuterol inhaler PRN  7. Paranoid Schizophrenia - patient is very angry, likely was not absorbing his meds for a while now Psych consult appreciated. Per Dr clapacs "As far as capacity, patients with schizophrenia can be something of a challenge. He most likely has the cognitive ability to comprehend but any proposed procedures will have to be presented to him in the most concrete straightforward manner with communication of worst case consequences if he refuses emphasized and at the same time this should be done with a minimum of emotional drama "  8. DVT prophylaxis-on Lovenox  Spoke with [name] on the phone. I will initiate the transfer process  All the records are reviewed and case discussed with Care Management/Social Workerr. Management plans discussed with the patient, family and they are in agreement.  CODE STATUS: Full code  TOTAL TIME TAKING CARE OF THIS PATIENT: 35 minutes.   POSSIBLE D/C ? DAYS, DEPENDING ON CLINICAL CONDITION.   Olean Sangster M.D on 11/03/2016 at 1:14 PM  Between 7am to 6pm - Pager - [phone]  After 6pm go to  [email or web address] - password [email or web address] [place] [facility]  Office  [phone]  CC: Primary care physician; No PCP Per Patient

## 2016-11-03 NOTE — Progress Notes (Signed)
Brief Nutrition Note  Consult received for parenteral nutrition. Central Access: Pending  Pharmacy to initiate Clinimix 5%AA/15%Dextrose at 56ml/hr with MVI and Trace Minerals at this time.   Admitting Dx: Cachexia ([place]) [R64] Dehydration [E86.0] Esophageal obstruction [K22.2] Unintentional weight loss [R63.4] AKI (acute kidney injury) ([place]) [N17.9]  Body mass index is 13.4 kg/m. Pt meets criteria for Underweight based on current BMI.  Labs:  Recent Labs Lab 11/01/16 1244 11/02/16 0415 11/03/16 0601  NA 134* 134* 136  K 2.4* 3.1* 3.9  CL 93* 98* 107  CO2 28 28 24   BUN 52* 49* 25*  CREATININE 2.06* 1.11 0.61  CALCIUM 9.1 8.9 8.6*  MG  --  1.8 1.7   Burtis Junes RD, LDN, CNSC Clinical Nutrition Pager: [phone] 11/03/2016 8:21 AM

## 2016-11-03 NOTE — Consult Note (Signed)
GI Inpatient Follow-up Note  Patient Identification: Roberto Davis is a 56 y.o. male with dysphagia and weight loss. EGD showed distal esophageal obstruction. 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 Biopsies pending   Subjective:  Scheduled Inpatient Medications:  . chlorhexidine  15 mL Mouth Rinse BID  . Chlorhexidine Gluconate Cloth  6 each Topical Q0600  . enoxaparin (LOVENOX) injection  30 mg Subcutaneous QHS  . LORazepam  2 mg Intravenous QHS  . mouth rinse  15 mL Mouth Rinse q12n4p  . mupirocin ointment  1 application Nasal BID  . OLANZapine zydis  10 mg Oral QHS  . potassium phosphate IVPB (mmol)  30 mmol Intravenous Once  . sodium chloride flush  3 mL Intravenous Q12H    Continuous Inpatient Infusions:   . TPN (CLINIMIX) Adult without lytes      PRN Inpatient Medications:  acetaminophen **OR** acetaminophen, albuterol, bisacodyl, iopamidol, LORazepam, ondansetron **OR** ondansetron (ZOFRAN) IV, ondansetron, phenol, pneumococcal 23 valent vaccine  Review of Systems:  Constitutional: Weight is stable.  Eyes: No changes in vision. ENT: No oral lesions, sore throat.  GI: see HPI.  Heme/Lymph: No easy bruising.  CV: No chest pain.  GU: No hematuria.  Integumentary: No rashes.  Neuro: No headaches.  Psych: No depression/anxiety.  Endocrine: No heat/cold intolerance.  Allergic/Immunologic: No urticaria.  Resp: No cough, SOB.  Musculoskeletal: No joint swelling.    Physical Examination: BP (!) 90/56 (BP Location: Left Arm)   Pulse (!) 55   Temp 98.5 F (36.9 C) (Oral)   Resp 18   Ht 5\' 10"  (1.778 m)   Wt 42.4 kg (93 lb 6.4 oz)   SpO2 100%   BMI 13.40 kg/m  Gen: NAD, alert and oriented x 4 HEENT: PEERLA, EOMI, Neck: supple, no JVD or thyromegaly Chest: CTA bilaterally, no wheezes, crackles, or other adventitious sounds CV: RRR, no m/g/c/r Abd: soft, NT, ND, +BS in all four quadrants; no HSM, guarding,  ridigity, or rebound tenderness Ext: no edema Skin: no rash or lesions noted   Data: Lab Results  Component Value Date   WBC 9.9 11/02/2016   HGB 13.0 11/02/2016   HCT 37.6 (L) 11/02/2016   MCV 88.8 11/02/2016   PLT 279 11/02/2016    Recent Labs Lab 11/01/16 1244 11/02/16 0415  HGB 16.0 13.0   Lab Results  Component Value Date   NA 136 11/03/2016   K 3.9 11/03/2016   CL 107 11/03/2016   CO2 24 11/03/2016   BUN 25 (H) 11/03/2016   CREATININE 0.61 11/03/2016   Lab Results  Component Value Date   ALT 13 (L) 11/03/2016   AST 16 11/03/2016   ALKPHOS 53 11/03/2016   BILITOT 0.9 11/03/2016   No results for input(s): APTT, INR, PTT in the last 168 hours.  Assessment/Plan: Roberto Davis is a 56 y.o. 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 Awaiting results of EGD biopsies.   Recommendations:  Please call with questions or concerns.  [name], MD

## 2016-11-03 NOTE — Progress Notes (Signed)
Patient refused to keep tele monitor on stated he want to go home MD made aware ,

## 2016-11-03 NOTE — Progress Notes (Signed)
Spoke with internal medicine hospitalist Dr. Jake Seats at [facility]. They want to wait till the biopsy results are back before they can initiate any transfer.

## 2016-11-04 LAB — BASIC METABOLIC PANEL
ANION GAP: 3 — AB (ref 5–15)
Anion gap: 4 — ABNORMAL LOW (ref 5–15)
BUN: 19 mg/dL (ref 6–20)
BUN: 19 mg/dL (ref 6–20)
CHLORIDE: 109 mmol/L (ref 101–111)
CHLORIDE: 105 mmol/L (ref 101–111)
CO2: 25 mmol/L (ref 22–32)
CO2: 28 mmol/L (ref 22–32)
Calcium: 8.2 mg/dL — ABNORMAL LOW (ref 8.9–10.3)
Calcium: 8.9 mg/dL (ref 8.9–10.3)
Creatinine, Ser: 0.48 mg/dL — ABNORMAL LOW (ref 0.61–1.24)
Creatinine, Ser: 0.63 mg/dL (ref 0.61–1.24)
GFR calc Af Amer: >60 mL/min (ref >60)
GFR calc non Af Amer: >60 mL/min (ref >60)
GFR calc non Af Amer: >60 mL/min (ref >60)
GLUCOSE: 95 mg/dL (ref 65–99)
Glucose, Bld: 102 mg/dL — ABNORMAL HIGH (ref 65–99)
POTASSIUM: 4 mmol/L (ref 3.5–5.1)
POTASSIUM: 4.9 mmol/L (ref 3.5–5.1)
SODIUM: 137 mmol/L (ref 135–145)
Sodium: 137 mmol/L (ref 135–145)

## 2016-11-04 LAB — GLUCOSE, CAPILLARY
GLUCOSE-CAPILLARY: 126 mg/dL — AB (ref 65–99)
Glucose-Capillary: 101 mg/dL — ABNORMAL HIGH (ref 65–99)
Glucose-Capillary: 111 mg/dL — ABNORMAL HIGH (ref 65–99)
Glucose-Capillary: 92 mg/dL (ref 65–99)

## 2016-11-04 LAB — MAGNESIUM
MAGNESIUM: 1.9 mg/dL (ref 1.7–2.4)
Magnesium: 1.6 mg/dL — ABNORMAL LOW (ref 1.7–2.4)

## 2016-11-04 LAB — PHOSPHORUS
PHOSPHORUS: 3.2 mg/dL (ref 2.5–4.6)
Phosphorus: 1.7 mg/dL — ABNORMAL LOW (ref 2.5–4.6)

## 2016-11-04 LAB — TRIGLYCERIDES: Triglycerides: 82 mg/dL (ref <150)

## 2016-11-04 MED ORDER — INSULIN ASPART 100 UNIT/ML ~~LOC~~ SOLN
0.0000 [IU] | SUBCUTANEOUS | Status: DC
  Administered 2016-11-04: 1 [IU] via SUBCUTANEOUS
  Filled 2016-11-04: qty 1

## 2016-11-04 MED ORDER — POTASSIUM PHOSPHATES 15 MMOLE/5ML IV SOLN
30.0000 mmol | Freq: Once | INTRAVENOUS | Status: AC
  Administered 2016-11-04: 30 mmol via INTRAVENOUS
  Filled 2016-11-04: qty 10

## 2016-11-04 MED ORDER — MAGNESIUM SULFATE 2 GM/50ML IV SOLN
2.0000 g | Freq: Once | INTRAVENOUS | Status: AC
  Administered 2016-11-04: 2 g via INTRAVENOUS
  Filled 2016-11-04: qty 50

## 2016-11-04 MED ORDER — TRACE MINERALS CR-CU-MN-SE-ZN 10-1000-500-60 MCG/ML IV SOLN
INTRAVENOUS | Status: AC
  Administered 2016-11-04: 18:00:00 via INTRAVENOUS
  Filled 2016-11-04: qty 960

## 2016-11-04 NOTE — Progress Notes (Signed)
[place] NOTE   Pharmacy Consult for TPN  Indication: failure to thrive  Patient Measurements: Height: 5\' 10"  (177.8 cm) Weight: 99 lb 6.9 oz (45.1 kg) IBW/kg (Calculated) : 73 TPN AdjBW (KG): 42.4 Body mass index is 14.27 kg/m.  Assessment:  55yoM with a hx of COPD and paranoid schizophrenia. Patient has severe cachexia, failure to thrive, and esophageal obstruction.    Insulin requirements in the past 24 hours: no order- added SSI  Lytes: K=4.9; Phos=3.2; Mg=1.9 Renal: Scr=0.63  1/21 1711 K 4.9, Mg 1.9, phos 3.2, Ca 8.9.   Plan:  BMET, Magnesium, phosphorous ordered for 1/22 @ 0500  Will continue TPN w/ lipids and recheck electrolytes tomorrow.  Thank you for this consult.  Tobie Lords, PharmD, BCPS Clinical Pharmacist 11/04/2016

## 2016-11-04 NOTE — Progress Notes (Signed)
[place] NOTE   Pharmacy Consult for TPN  Indication: failure to thrive  Patient Measurements: Height: 5\' 10"  (177.8 cm) Weight: 99 lb 6.9 oz (45.1 kg) IBW/kg (Calculated) : 73 TPN AdjBW (KG): 42.4 Body mass index is 14.27 kg/m.  Assessment:  55yoM with a hx of COPD and paranoid schizophrenia. Patient has severe cachexia, failure to thrive, and esophageal obstruction.    Insulin requirements in the past 24 hours: no order- added SSI  Lytes: K=4.0; Phos=1.7; Mg=1.6 Renal: Scr=0.48   Best Practices: TPN Access: PICC line is suppose to be placed today 1/20.  TPN start date: 11/03/16  Plan:  Ordered daily weights, strict intake and output, BMP, Mg, Phos, TG, and CBG Q6h.   Initiate Clinimix 5/15 w/out electrolytes at 22ml/hr.  Added MVI and trace elements in TPN  1/20 2139 K 4.3, phos 2.5 after receiving KPhos 30 mmol IV x 1 today. Will recheck electrolytes tomorrow with AM labs.   1/21 0419 K 4, Mg 1.6, phos 1.7, Ca 8.2, adjusted Ca 9. Potassium phosphate 30 mmol IV x 1, magnesium sulfate 2 gm IV x 1, recheck electrolytes this evening.   1/21 Continue Clinimix 5/15 NO ELECTROLYTES at 40 ml/hr per dietician note. TG= 82   Yahya Boldman A, PharmD, BCPS Clinical Pharmacist 11/04/2016,12:18 PM

## 2016-11-04 NOTE — Progress Notes (Signed)
Initial Nutrition Assessment  DOCUMENTATION CODES:   Severe malnutrition in context of chronic illness  INTERVENTION:   TPN- Clinimix 5%AA/15% dextrose without electrolytes with added MVI and trace elements  Recommend continiuing current TPN at 63ml/hr as pt is at high risk for refeeding syndrome and Phosphorus low today at 1.7. Continue to supplement Phosphorus and Mg as needed.  Clinamix 5%AA/15% dextrose without electrolytes goal rate of 2ml/hr provides 1414kcal/day (83-94% estimated needs) 100g protein/day (105-125% estimated needs) 1953ml of fluid    NUTRITION DIAGNOSIS:   Malnutrition related to chronic illness, dysphagia as evidenced by severe depletion of body fat, severe depletion of muscle mass, percent weight loss.  GOAL:   Patient will meet greater than or equal to 90% of their needs  MONITOR:   Labs, Weight trends, I & O's, Other (Comment) (TPN advancement )  REASON FOR ASSESSMENT:   Consult New TPN/TNA  ASSESSMENT:   56 year old male with past medical history paranoid schizophrenia, COPD, hyperlipidemia, who presents to the hospital due to ongoing weight loss, dysphagia and noted to have an upper GI series concerning for a high-grade esophageal stricture concerning for neoplasm.   Unable to place PEG in this pt r/t small, contracted stomach. Pt would benefit from enteral feedings, even just at trickle rate, to promote immune system and reduce inflammation. Pt initiated on TPN 1/20. RD reluctant to increase TPN r/t low Phophorus and Mg today and pt at high risk for refeeding syndrome. Phos today 1.7. Pt receiving Kphos 55mmol in dextrose as needed. Last dose was this morning. Pt's Mg today 1.6. Pt scheduled to have P and Mg rechecked at 1700 today.   Medications reviewed and include: lovenox, ativan, Kphos, Mg sulfate  Labs reviewed: K 4.0 wnl, creat 0.48(L), Ca 8.2(L) adj. 9 wnl, Phos 1.7(L), Mg 1.6(L), Alb 3.0(L)-1/20 Triglycerides- 82 cbgs- 86, 95 x 24  hrs  Diet Order:  Diet NPO time specified TPN (CLINIMIX) Adult without lytes  Skin:  Wound (see comment) (Stage I coccyx)  Last BM:  1/19  Height:   Ht Readings from Last 1 Encounters:  11/02/16 5\' 10"  (1.778 m)    Weight:   Wt Readings from Last 1 Encounters:  11/04/16 99 lb 6.9 oz (45.1 kg)    Ideal Body Weight:  75.4 kg  BMI:  Body mass index is 14.27 kg/m.  Estimated Nutritional Needs:   Kcal:  1500-1700kcal/day   Protein:  80-95g/day   Fluid:  >5L/day   EDUCATION NEEDS:   Education needs no appropriate at this time  Koleen Distance, RD, [place] Pager #[phone]

## 2016-11-04 NOTE — Progress Notes (Signed)
[facility] [place] NAME: Roberto Davis    MR#:  [id number]  DATE OF BIRTH:  Mar 05, 1961  SUBJECTIVE:   - Patient admitted with weight loss and dysphagia. EGD done  showing distal esophageal stricture and dilatation of esophagus proximal to that. -Recommended nothing by mouth and parenteral nutrition at this time. -Patient quite pleasant today. TPN has been initiated after PICC line was placed last evening.  REVIEW OF SYSTEMS:  Review of Systems  Unable to perform ROS: Psychiatric disorder    DRUG ALLERGIES:   Allergies  Allergen Reactions  . Haldol [Haloperidol Lactate] Other (See Comments)    Shaking uncontrollable   . Ampicillin   . Depakote [Divalproex Sodium]   . Lamictal [Lamotrigine]   . Penicillins Other (See Comments)    Reaction unknown-childhood allergy    VITALS:  Blood pressure 92/60, pulse (!) 56, temperature 98.3 F (36.8 C), temperature source Oral, resp. rate 18, height 5\' 10"  (1.778 m), weight 45.1 kg (99 lb 6.9 oz), SpO2 99 %.  PHYSICAL EXAMINATION:  Physical Exam  GENERAL:  56 y.o.-year-old severely malnourished patient lying in the bed with no acute distress.  EYES: Pupils equal, round, reactive to light and accommodation. No scleral icterus. Extraocular muscles intact.  HEENT: Head atraumatic, normocephalic. Oropharynx and nasopharynx clear.  NECK:  Supple, no jugular venous distention. No thyroid enlargement, no tenderness.  LUNGS: Normal breath sounds bilaterally, no wheezing, rales,rhonchi or crepitation. No use of accessory muscles of respiration.  CARDIOVASCULAR: S1, S2 normal. No murmurs, rubs, or gallops.  ABDOMEN: Soft, nontender, nondistended. Bowel sounds present. No organomegaly or mass.  EXTREMITIES: No pedal edema, cyanosis, or clubbing. Right upper extremity PICC line NEUROLOGIC: Cranial nerves II through XII are intact. Able to Move extremities in bed. Sensation intact, not following  commands because he is very irritable PSYCHIATRIC:  patient is alert and oriented. Very angry  SKIN: No obvious rash, lesion, or ulcer.    LABORATORY PANEL:   CBC  Recent Labs Lab 11/02/16 0415  WBC 9.9  HGB 13.0  HCT 37.6*  PLT 279   ------------------------------------------------------------------------------------------------------------------  Chemistries   Recent Labs Lab 11/03/16 0601  11/04/16 0419  NA 136  --  137  K 3.9  < > 4.0  CL 107  --  109  CO2 24  --  25  GLUCOSE 86  --  95  BUN 25*  --  19  CREATININE 0.61  --  0.48*  CALCIUM 8.6*  --  8.2*  MG 1.7  --  1.6*  AST 16  --   --   ALT 13*  --   --   ALKPHOS 53  --   --   BILITOT 0.9  --   --   < > = values in this interval not displayed. ------------------------------------------------------------------------------------------------------------------  Cardiac Enzymes  Recent Labs Lab 11/01/16 1244  TROPONINI 0.04*   ------------------------------------------------------------------------------------------------------------------  RADIOLOGY:  Dg Chest Port 1 View  Result Date: 11/03/2016 CLINICAL DATA:  PICC line EXAM: PORTABLE CHEST 1 VIEW COMPARISON:  11/01/2016, 07/18/2016 FINDINGS: Hyperinflation is present. No acute infiltrate or effusion. Normal heart size. Right-sided central venous catheter tip overlies the SVC. No pneumothorax. IMPRESSION: Right upper extremity catheter tip overlies the SVC. Hyperinflation without focal infiltrate Electronically Signed   By: Donavan Foil M.D.   On: 11/03/2016 20:04    EKG:   Orders placed or performed during the hospital encounter of 11/01/16  . ED EKG  .  ED EKG    ASSESSMENT AND PLAN:   56 year old male with past medical history paranoid schizophrenia, COPD, hyperlipidemia, who presents to the hospital due to ongoing weight loss, dysphagia and noted to have an upper GI series concerning for a high-grade esophageal stricture concerning for  neoplasm.  1. Dysphagia-patient had an upper GI series which shows a high-grade esophageal stricture worrisome for malignancy. -Weight loss, dysphagia and nausea vomiting going on severely for the last 3 months at least. -EGD done showing severe achalasia and a distal esophageal complete stricture/obstruction. Biopsies done. -Appreciate GI consult. Unable to pass the probe through endoscopy currently. --Consulted interventional radiologist Dr. Kathlene Cote on call, because his stomach is so shrunken, and has hiatal hernia, unable to place a G-tube at this time. -Is and will be nothing by mouth, changes oral medications to other routes. -Dietitian consult for TPN -Discussed with surgery who recommends patient be transferred to tertiary care center given complexity of the case and unable to handle here. This was discussed with Dr. Azalee Course -A long discussion with patient's healthcare power of attorney Roberto Davis and patient has [name] also listed as healthcare power of attorney per Ms. Davis. Ms Davis is agreeable to patient be transferred to any tertiary care center in the area  -Right upper extremity PICC line placed on 11/03/2016. TPN started.  2. Acute kidney injury-secondary to dehydration. -on IV TPN  3. Hypokalemia-secondary to poor by mouth intake and dehydration. -on supplements   4. Adult Failure to thrive - due to possible malignancy and ongoing dysphagia.  Biopsy results in 3 days. Then may be oncology/palliative consults  5. Hyperlipidemia - cont. Crestor.   6. COPD - no acute exacerbation.  - cont. Albuterol inhaler PRN  7. Paranoid Schizophrenia - patient is very angry, likely was not absorbing his meds for a while now Psych consult appreciated. Per Dr clapacs "As far as capacity, patients with schizophrenia can be something of a challenge. He most likely has the cognitive ability to comprehend but any proposed procedures will have to be presented to him in the most  concrete straightforward manner with communication of worst case consequences if he refuses emphasized and at the same time this should be done with a minimum of emotional drama "  8. DVT prophylaxis-on Lovenox  Spoke with [name] on the phone. I will initiate the transfer process once have the biopsy results back  All the records are reviewed and case discussed with Care Management/Social Workerr. Management plans discussed with the patient, family and they are in agreement.  CODE STATUS: Full code  TOTAL TIME TAKING CARE OF THIS PATIENT: 35 minutes.   POSSIBLE D/C ? DAYS, DEPENDING ON CLINICAL CONDITION.   Tauno Falotico M.D on 11/04/2016 at 1:39 PM  Between 7am to 6pm - Pager - [phone]  After 6pm go to [email or web address] - password [email or web address] [facility]  Office  [phone]  CC: Primary care physician; No PCP Per Patient

## 2016-11-04 NOTE — Progress Notes (Signed)
Patient ambulated around the nurses station once with standby assist and gait bait. Tolerated well. Nursing staff will continue to monitor for any changes in patient status. Earleen Reaper, RN

## 2016-11-04 NOTE — Plan of Care (Signed)
Problem: Fluid Volume: Goal: Ability to maintain a balanced intake and output will improve Outcome: Progressing Patient currently on TPN at 40/hr, weight has improved.

## 2016-11-05 ENCOUNTER — Encounter: Payer: Self-pay | Admitting: Gastroenterology

## 2016-11-05 ENCOUNTER — Other Ambulatory Visit: Payer: Self-pay | Admitting: Pathology

## 2016-11-05 LAB — BASIC METABOLIC PANEL
Anion gap: 4 — ABNORMAL LOW (ref 5–15)
BUN: 20 mg/dL (ref 6–20)
CALCIUM: 8.6 mg/dL — AB (ref 8.9–10.3)
CO2: 26 mmol/L (ref 22–32)
CREATININE: 0.57 mg/dL — AB (ref 0.61–1.24)
Chloride: 106 mmol/L (ref 101–111)
GFR calc Af Amer: >60 mL/min (ref >60)
Glucose, Bld: 90 mg/dL (ref 65–99)
POTASSIUM: 4.2 mmol/L (ref 3.5–5.1)
SODIUM: 136 mmol/L (ref 135–145)

## 2016-11-05 LAB — GLUCOSE, CAPILLARY
GLUCOSE-CAPILLARY: 87 mg/dL (ref 65–99)
GLUCOSE-CAPILLARY: 94 mg/dL (ref 65–99)
GLUCOSE-CAPILLARY: 105 mg/dL — AB (ref 65–99)
GLUCOSE-CAPILLARY: 88 mg/dL (ref 65–99)
Glucose-Capillary: 100 mg/dL — ABNORMAL HIGH (ref 65–99)
Glucose-Capillary: 101 mg/dL — ABNORMAL HIGH (ref 65–99)

## 2016-11-05 LAB — PHOSPHORUS: Phosphorus: 2.3 mg/dL — ABNORMAL LOW (ref 2.5–4.6)

## 2016-11-05 LAB — MAGNESIUM: MAGNESIUM: 1.4 mg/dL — AB (ref 1.7–2.4)

## 2016-11-05 MED ORDER — SODIUM PHOSPHATES 45 MMOLE/15ML IV SOLN
8.0000 mmol | Freq: Once | INTRAVENOUS | Status: AC
  Administered 2016-11-05: 8 mmol via INTRAVENOUS
  Filled 2016-11-05: qty 2.67

## 2016-11-05 MED ORDER — TRACE MINERALS CR-CU-MN-SE-ZN 10-1000-500-60 MCG/ML IV SOLN
INTRAVENOUS | Status: AC
  Administered 2016-11-05: 22:00:00 via INTRAVENOUS
  Filled 2016-11-05: qty 1000

## 2016-11-05 MED ORDER — MAGNESIUM SULFATE 2 GM/50ML IV SOLN: 2.0000 g | Freq: Once | INTRAVENOUS | Status: DC

## 2016-11-05 MED ORDER — MAGNESIUM SULFATE 4 GM/100ML IV SOLN
4.0000 g | Freq: Once | INTRAVENOUS | Status: AC
  Administered 2016-11-05: 4 g via INTRAVENOUS
  Filled 2016-11-05: qty 100

## 2016-11-05 NOTE — Progress Notes (Signed)
[facility] NOTE   Pharmacy Consult for TPN  Indication: failure to thrive  Patient Measurements: Height: 5\' 10"  (177.8 cm) Weight: 99 lb 6.9 oz (45.1 kg) IBW/kg (Calculated) : 73 TPN AdjBW (KG): 42.4 Body mass index is 14.27 kg/m.  Assessment:  29 yoM with a hx of COPD and paranoid schizophrenia. Patient has severe cachexia, failure to thrive, and esophageal obstruction.   Insulin requirements in the past 24 hours: 1 unit  Lytes: K = 4.2, Mg = 1.4, Phos = 2.3  Plan:  Patient has orders to continue TPN Clinimix 5/15 with electrolyte additives at a rate of 40 mL/hr. Will replace electrolytes with magnesium 4 mg IV x 1 dose and sodium phosphate 8 mmol IV x 1 dose and recheck electrolytes with AM labs tomorrow.   Continue insulin sliding scale as ordered.  Thank you for this consult.  Lenis Noon, PharmD, BCPS Clinical Pharmacist 11/05/2016

## 2016-11-05 NOTE — Progress Notes (Signed)
Nutrition Follow-up  DOCUMENTATION CODES:   Severe malnutrition in context of chronic illness  INTERVENTION:  1. Continue 5%AA/15% dextrose @ 43mL/hr with electrolytes added, electrolytes still outside of normal limits with Phos 2.3, Mg 1.4 -Goal rate 3mL/hr  NUTRITION DIAGNOSIS:   Malnutrition related to chronic illness, dysphagia as evidenced by severe depletion of body fat, severe depletion of muscle mass, percent weight loss. -ongoing  GOAL:   Patient will meet greater than or equal to 90% of their needs -progressing  MONITOR:   Labs, Weight trends, I & O's, Other (Comment) (TPN advancement )  ASSESSMENT:   56 year old male with past medical history paranoid schizophrenia, COPD, hyperlipidemia, who presents to the hospital due to ongoing weight loss, dysphagia and noted to have an upper GI series concerning for a high-grade esophageal stricture concerning for neoplasm.  Patient was ambulating today. Tolerating TPN Still unable to advance to goal with Phos and Mg outside normal limits CBGs WDL Weight stable. Labs and medications reviewed.  Diet Order:  Diet NPO time specified TPN [facility]) Adult without lytes TPN (CLINIMIX 5/15) Adult with electrolyte additives  Skin:  Wound (see comment) (Stage I coccyx)  Last BM:  1/19  Height:   Ht Readings from Last 1 Encounters:  11/02/16 5\' 10"  (1.778 m)    Weight:   Wt Readings from Last 1 Encounters:  11/05/16 99 lb 8 oz (45.1 kg)    Ideal Body Weight:  75.4 kg  BMI:  Body mass index is 14.28 kg/m.  Estimated Nutritional Needs:   Kcal:  1500-1700kcal/day   Protein:  80-95g/day   Fluid:  >5L/day   EDUCATION NEEDS:   Education needs no appropriate at this time  Roberto Davis. Roberto Simien, MS, RD LDN Inpatient Clinical Dietitian Pager [phone]

## 2016-11-05 NOTE — Progress Notes (Signed)
Report called and given to [facility] on [place]. Patient transferred with all belongings and [facility]CPOA [place] notified of patient's transfer. Earleen Reaper, RN

## 2016-11-05 NOTE — Progress Notes (Signed)
Patient ambulated in hall around nurses station with gait belt in place and standby assist. Tolerated well. VSS will continue to monitor.

## 2016-11-05 NOTE — Consult Note (Signed)
[place] Psychiatry Consult   Reason for Consult:  Consult for 56 year old man with a history of schizophrenia who is admitted to the hospital because of esophageal obstruction Referring Physician:  Tressia Miners Patient Identification: Roberto Davis MRN:  [id number] Principal Diagnosis: Paranoid schizophrenia [facility]) Diagnosis:   Patient Active Problem List   Diagnosis Date Noted  . Pressure injury of skin [L89.90] 11/02/2016  . Protein-calorie malnutrition, severe [E43] 11/02/2016  . Acute renal failure ([place]) [N17.9] 11/01/2016  . Paranoid schizophrenia ([place]) [F20.0]   . High cholesterol [E78.00]   . COPD (chronic obstructive pulmonary disease) ([place]) [J44.9]     Total Time spent with patient: 20 minutes  Subjective:   Roberto Davis is a 56 y.o. male patient admitted with "I lost a lot of weight". Follow-up note for Monday the 22nd. Patient seen chart reviewed. 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 Or rather, he understood that he had cancer but he actually still did not know what part of his body was located in. I explained to him where the cancer was primarily located and why it was blocking his ability to eat. Patient denies any major psychiatric symptoms. Says he is mostly calm and feeling okay right now. He does admit to being anxious. I knowledge to him that anyone would be anxious under these circumstances but he is holding it together well and sleeping well. Not having any major behavior problems.  HPI:  Patient interviewed. Chart reviewed. This is a 56 year old man with schizophrenia. Brought from his group home living facility for EGD to investigate abnormal barium swallow and difficulty swallowing. Patient's chief complaint is that he has lost a great deal of weight. He seems to indicate that perhaps the weight loss is even more than would be expected from his inability to swallow but his history is a little hard to follow. He says that he's been losing  weight since last summer and has been having trouble swallowing since then too. He says recently it's gotten to where he throws up pretty much everything he eats. Patient says he feels depressed because he looks so unusual. That seems to be his chief complaint around. He says otherwise his mood has been stable. Sleep has been okay. He says he has not been having any hallucinations. Totally denies suicidal or homicidal ideation. Current medications apparently are Zyprexa Zydis and Ativan at night only.    Social history: Patient is not married. He says his closest living relative is his adopted brother. He lives in a group home and has a legal guardian.  Medical history: History of COPD, elevated cholesterol, gastric reflux symptoms. No previously identified history of cancer that I can identify.  Substance abuse history: Denies any alcohol or drug abuse anytime recently. Not clear if he might of had some of the distant past.  Past Psychiatric History: Patient has a history of schizophrenia. He has had multiple hospitalizations throughout his life but we have not seen him psychiatrically at our hospital in over 2 years. He has been stable taking medication provided by his outpatient psychiatric provider. He does have a history of some agitation when emotionally distraught in the past. Does not have a history of serious injury to others.  Risk to Self: Is patient at risk for suicide?: No Risk to Others:   Prior Inpatient Therapy:   Prior Outpatient Therapy:    Past Medical History:  Past Medical History:  Diagnosis Date  . COPD (chronic obstructive pulmonary disease) ([place])   .  High cholesterol   . Paranoid schizophrenia [facility])     Past Surgical History:  Procedure Laterality Date  . DG BARIUM SWALLOW ([place] [facility])    . ESOPHAGOGASTRODUODENOSCOPY (EGD) WITH PROPOFOL N/A 10/25/2016   Procedure: ESOPHAGOGASTRODUODENOSCOPY (EGD) WITH PROPOFOL;  Surgeon: Jonathon Bellows, MD;  Location: [facility] ENDOSCOPY;   Service: Endoscopy;  Laterality: N/A;  . ESOPHAGOGASTRODUODENOSCOPY (EGD) WITH PROPOFOL N/A 11/02/2016   Procedure: ESOPHAGOGASTRODUODENOSCOPY (EGD) WITH PROPOFOL;  Surgeon: Lucilla Lame, MD;  Location: [facility] ENDOSCOPY;  Service: Endoscopy;  Laterality: N/A;  . HAND SURGERY     Family History:  Family History  Problem Relation Age of Onset  . Heart failure Mother    Family Psychiatric  History: Patient is not aware of there being a family history Social History:  History  Alcohol Use No     History  Drug Use  . Types: Marijuana, "Crack" cocaine    Comment: Used to do Crack, Cocaine (quit 9 yrs ago)    Social History   Social History  . Marital status: Single    Spouse name: N/A  . Number of children: N/A  . Years of education: N/A   Social History Main Topics  . Smoking status: Current Every Day Smoker    Packs/day: 0.25    Years: 40.00    Types: Cigarettes  . Smokeless tobacco: Never Used  . Alcohol use No  . Drug use: Yes    Types: Marijuana, "Crack" cocaine     Comment: Used to do Crack, Cocaine (quit 9 yrs ago)  . Sexual activity: Not Asked   Other Topics Concern  . None   Social History Narrative  . None   Additional Social History:    Allergies:   Allergies  Allergen Reactions  . Haldol [Haloperidol Lactate] Other (See Comments)    Shaking uncontrollable   . Ampicillin   . Depakote [Divalproex Sodium]   . Lamictal [Lamotrigine]   . Penicillins Other (See Comments)    Reaction unknown-childhood allergy    Labs:  Results for orders placed or performed during the hospital encounter of 11/01/16 (from the past 48 hour(s))  Phosphorus     Status: None   Collection Time: 11/03/16  9:39 PM  Result Value Ref Range   Phosphorus 2.5 2.5 - 4.6 mg/dL  Potassium     Status: None   Collection Time: 11/03/16  9:39 PM  Result Value Ref Range   Potassium 4.3 3.5 - 5.1 mmol/L  Triglycerides     Status: None   Collection Time: 11/04/16  4:19 AM  Result Value  Ref Range   Triglycerides 82 <150 mg/dL  Basic metabolic panel     Status: Abnormal   Collection Time: 11/04/16  4:19 AM  Result Value Ref Range   Sodium 137 135 - 145 mmol/L   Potassium 4.0 3.5 - 5.1 mmol/L   Chloride 109 101 - 111 mmol/L   CO2 25 22 - 32 mmol/L   Glucose, Bld 95 65 - 99 mg/dL   BUN 19 6 - 20 mg/dL   Creatinine, Ser 0.48 (L) 0.61 - 1.24 mg/dL   Calcium 8.2 (L) 8.9 - 10.3 mg/dL   GFR calc non Af Amer >60 >60 mL/min   GFR calc Af Amer >60 >60 mL/min    Comment: (NOTE) The eGFR has been calculated using the CKD EPI equation. This calculation has not been validated in all clinical situations. eGFR's persistently <60 mL/min signify possible Chronic Kidney Disease.    Anion gap 3 (L)  5 - 15  Magnesium     Status: Abnormal   Collection Time: 11/04/16  4:19 AM  Result Value Ref Range   Magnesium 1.6 (L) 1.7 - 2.4 mg/dL  Phosphorus     Status: Abnormal   Collection Time: 11/04/16  4:19 AM  Result Value Ref Range   Phosphorus 1.7 (L) 2.5 - 4.6 mg/dL  Glucose, capillary     Status: Abnormal   Collection Time: 11/04/16  6:42 AM  Result Value Ref Range   Glucose-Capillary 101 (H) 65 - 99 mg/dL  Glucose, capillary     Status: Abnormal   Collection Time: 11/04/16 11:23 AM  Result Value Ref Range   Glucose-Capillary 126 (H) 65 - 99 mg/dL   Comment 1 Notify RN    Comment 2 Document in Chart   Basic metabolic panel     Status: Abnormal   Collection Time: 11/04/16  5:11 PM  Result Value Ref Range   Sodium 137 135 - 145 mmol/L   Potassium 4.9 3.5 - 5.1 mmol/L   Chloride 105 101 - 111 mmol/L   CO2 28 22 - 32 mmol/L   Glucose, Bld 102 (H) 65 - 99 mg/dL   BUN 19 6 - 20 mg/dL   Creatinine, Ser 0.63 0.61 - 1.24 mg/dL   Calcium 8.9 8.9 - 10.3 mg/dL   GFR calc non Af Amer >60 >60 mL/min   GFR calc Af Amer >60 >60 mL/min    Comment: (NOTE) The eGFR has been calculated using the CKD EPI equation. This calculation has not been validated in all clinical situations. eGFR's  persistently <60 mL/min signify possible Chronic Kidney Disease.    Anion gap 4 (L) 5 - 15  Magnesium     Status: None   Collection Time: 11/04/16  5:11 PM  Result Value Ref Range   Magnesium 1.9 1.7 - 2.4 mg/dL  Phosphorus     Status: None   Collection Time: 11/04/16  5:11 PM  Result Value Ref Range   Phosphorus 3.2 2.5 - 4.6 mg/dL  Glucose, capillary     Status: Abnormal   Collection Time: 11/04/16  5:23 PM  Result Value Ref Range   Glucose-Capillary 111 (H) 65 - 99 mg/dL   Comment 1 Notify RN    Comment 2 Document in Chart   Glucose, capillary     Status: None   Collection Time: 11/04/16  8:04 PM  Result Value Ref Range   Glucose-Capillary 92 65 - 99 mg/dL  Glucose, capillary     Status: Abnormal   Collection Time: 11/05/16 12:03 AM  Result Value Ref Range   Glucose-Capillary 101 (H) 65 - 99 mg/dL   Comment 1 Notify RN   Glucose, capillary     Status: None   Collection Time: 11/05/16  4:09 AM  Result Value Ref Range   Glucose-Capillary 87 65 - 99 mg/dL   Comment 1 Notify RN   Basic metabolic panel     Status: Abnormal   Collection Time: 11/05/16  6:25 AM  Result Value Ref Range   Sodium 136 135 - 145 mmol/L   Potassium 4.2 3.5 - 5.1 mmol/L   Chloride 106 101 - 111 mmol/L   CO2 26 22 - 32 mmol/L   Glucose, Bld 90 65 - 99 mg/dL   BUN 20 6 - 20 mg/dL   Creatinine, Ser 0.57 (L) 0.61 - 1.24 mg/dL   Calcium 8.6 (L) 8.9 - 10.3 mg/dL   GFR calc non Af Amer >60 >  60 mL/min   GFR calc Af Amer >60 >60 mL/min    Comment: (NOTE) The eGFR has been calculated using the CKD EPI equation. This calculation has not been validated in all clinical situations. eGFR's persistently <60 mL/min signify possible Chronic Kidney Disease.    Anion gap 4 (L) 5 - 15  Magnesium     Status: Abnormal   Collection Time: 11/05/16  6:25 AM  Result Value Ref Range   Magnesium 1.4 (L) 1.7 - 2.4 mg/dL  Phosphorus     Status: Abnormal   Collection Time: 11/05/16  6:25 AM  Result Value Ref Range    Phosphorus 2.3 (L) 2.5 - 4.6 mg/dL  Glucose, capillary     Status: None   Collection Time: 11/05/16  7:40 AM  Result Value Ref Range   Glucose-Capillary 94 65 - 99 mg/dL   Comment 1 Notify RN    Comment 2 Document in Chart   Glucose, capillary     Status: Abnormal   Collection Time: 11/05/16 12:53 PM  Result Value Ref Range   Glucose-Capillary 100 (H) 65 - 99 mg/dL   Comment 1 Notify RN    Comment 2 Document in Chart   Glucose, capillary     Status: Abnormal   Collection Time: 11/05/16  5:21 PM  Result Value Ref Range   Glucose-Capillary 105 (H) 65 - 99 mg/dL   Comment 1 Notify RN    Comment 2 Document in Chart     Current Facility-Administered Medications  Medication Dose Route Frequency Provider Last Rate Last Dose  . acetaminophen (TYLENOL) tablet 650 mg  650 mg Oral Q6H PRN Henreitta Leber, MD       Or  . acetaminophen (TYLENOL) suppository 650 mg  650 mg Rectal Q6H PRN Henreitta Leber, MD   650 mg at 11/02/16 2317  . albuterol (PROVENTIL) (2.5 MG/3ML) 0.083% nebulizer solution 3 mL  3 mL Inhalation Q6H PRN Henreitta Leber, MD      . bisacodyl (DULCOLAX) suppository 10 mg  10 mg Rectal Daily PRN Gladstone Lighter, MD   10 mg at 11/02/16 2236  . chlorhexidine (PERIDEX) 0.12 % solution 15 mL  15 mL Mouth Rinse BID Henreitta Leber, MD   15 mL at 11/05/16 1000  . Chlorhexidine Gluconate Cloth 2 % PADS 6 each  6 each Topical Q0600 Henreitta Leber, MD   6 each at 11/05/16 0600  . enoxaparin (LOVENOX) injection 30 mg  30 mg Subcutaneous QHS Henreitta Leber, MD   30 mg at 11/04/16 2123  . insulin aspart (novoLOG) injection 0-9 Units  0-9 Units Subcutaneous Q4H Gladstone Lighter, MD   1 Units at 11/04/16 1255  . iopamidol (ISOVUE-300) 61 % injection 30 mL  30 mL Oral Once PRN Earleen Newport, MD      . LORazepam (ATIVAN) injection 1 mg  1 mg Intravenous Q6H PRN Gladstone Lighter, MD   1 mg at 11/05/16 1258  . LORazepam (ATIVAN) injection 2 mg  2 mg Intravenous QHS Gonzella Lex, MD    2 mg at 11/04/16 2123  . MEDLINE mouth rinse  15 mL Mouth Rinse q12n4p Henreitta Leber, MD   15 mL at 11/05/16 1600  . mupirocin ointment (BACTROBAN) 2 % 1 application  1 application Nasal BID Henreitta Leber, MD   1 application at 23/53/61 [phone]  . OLANZapine zydis (ZYPREXA) disintegrating tablet 10 mg  10 mg Oral QHS Gonzella Lex, MD   10  mg at 11/04/16 2122  . ondansetron (ZOFRAN) tablet 4 mg  4 mg Oral Q6H PRN Henreitta Leber, MD       Or  . ondansetron (ZOFRAN) injection 4 mg  4 mg Intravenous Q6H PRN Henreitta Leber, MD      . ondansetron (ZOFRAN) tablet 4 mg  4 mg Oral Q4H PRN Henreitta Leber, MD      . phenol (CHLORASEPTIC) mouth spray 1 spray  1 spray Mouth/Throat PRN Gladstone Lighter, MD   1 spray at 11/04/16 0937  . pneumococcal 23 valent vaccine (PNU-IMMUNE) injection 0.5 mL  0.5 mL Intramuscular Prior to discharge Henreitta Leber, MD      . sodium chloride flush (NS) 0.9 % injection 10-40 mL  10-40 mL Intracatheter Q12H Fritzi Mandes, MD   10 mL at 11/05/16 1000  . sodium chloride flush (NS) 0.9 % injection 10-40 mL  10-40 mL Intracatheter PRN Fritzi Mandes, MD      . sodium chloride flush (NS) 0.9 % injection 3 mL  3 mL Intravenous Q12H Henreitta Leber, MD   3 mL at 11/05/16 1000  . TPN (CLINIMIX 5/15) Adult with electrolyte additives   Intravenous Continuous TPN Fritzi Mandes, MD        Musculoskeletal: Strength & Muscle Tone: decreased and atrophy Gait & Station: unsteady Patient leans: N/A  Psychiatric Specialty Exam: Physical Exam  Nursing note and vitals reviewed. Constitutional: He appears cachectic. No distress.  HENT:  Head: Normocephalic and atraumatic.  Eyes: Conjunctivae are normal. Pupils are equal, round, and reactive to light.  Neck: Normal range of motion.  Cardiovascular: Regular rhythm.   Respiratory: Effort normal. No respiratory distress.  GI: Soft.  Musculoskeletal: Normal range of motion.  Neurological: He is alert.  Skin: Skin is warm and dry.   Psychiatric: His speech is normal and behavior is normal. His affect is blunt. Thought content is not delusional. Cognition and memory are impaired. He expresses impulsivity. He expresses no homicidal and no suicidal ideation.    Review of Systems  Constitutional: Positive for malaise/fatigue and weight loss.  HENT: Negative.   Eyes: Negative.   Respiratory: Negative.   Cardiovascular: Negative.   Gastrointestinal: Positive for nausea and vomiting.  Musculoskeletal: Negative.   Skin: Negative.   Neurological: Positive for weakness.  Psychiatric/Behavioral: Positive for depression. Negative for hallucinations, memory loss, substance abuse and suicidal ideas. The patient is nervous/anxious. The patient does not have insomnia.     Blood pressure 96/65, pulse (!) 56, temperature 97.6 F (36.4 C), resp. rate 16, height '5\' 10"'$  (1.778 m), weight 45.1 kg (99 lb 8 oz), SpO2 99 %.Body mass index is 14.28 kg/m.  General Appearance: Disheveled  Eye Contact:  Fair  Speech:  Slow  Volume:  Decreased  Mood:  Dysphoric  Affect:  Blunt and Congruent  Thought Process:  Disorganized  Orientation:  Full (Time, Place, and Person)  Thought Content:  Rumination and Tangential  Suicidal Thoughts:  No  Homicidal Thoughts:  No  Memory:  Immediate;   Good Recent;   Fair Remote;   Fair  Judgement:  Impaired  Insight:  Fair  Psychomotor Activity:  Decreased  Concentration:  Concentration: Fair  Recall:  [employer] of Knowledge:  Fair  Language:  Fair  Akathisia:  No  Handed:  Right  AIMS (if indicated):     Assets:  Desire for Improvement Housing Resilience Social Support  ADL's:  Impaired  Cognition:  Impaired,  Mild  Sleep:  Treatment Plan Summary: Daily contact with patient to assess and evaluate symptoms and progress in treatment, Medication management and Plan Into new medication as currently prescribed. I will follow-up as needed. Supportive counseling and education completed.  additionally, I believe he has a legal guardian. This should probably be confirmed because if that is the case that person would have to give consent for surgical treatment.  Disposition: Patient does not meet criteria for psychiatric inpatient admission. Supportive therapy provided about ongoing stressors.  Alethia Berthold, MD 11/05/2016 8:11 PM

## 2016-11-05 NOTE — Progress Notes (Signed)
Dr. Posey Pronto notified that patients power of attorney Erling Cruz needs to be notified. She stated that she will call him in the morning to update him.

## 2016-11-05 NOTE — Consult Note (Signed)
  Chart reviewed  Patient with near obstructing versus obstructing esophageal cancer. Difficult situation.  Full surgical consultation to follow tomorrow.  Clayburn Pert, MD [facility]

## 2016-11-05 NOTE — Care Management Important Message (Signed)
Important Message  Patient Details  Name: Roberto Davis MRN: [id number] Date of Birth: 1960-11-23   Medicare Important Message Given:  Yes    Shelbie Ammons, RN 11/05/2016, 12:18 PM

## 2016-11-05 NOTE — Progress Notes (Signed)
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  I will sign off.  Please call me if any further GI concerns or questions.  We would like to thank you for the opportunity to participate in the care of Roberto Davis.   Dr Jonathon Bellows  Gastroenterology/Hepatology Pager: [phone]

## 2016-11-05 NOTE — Progress Notes (Signed)
[facility] [place] NAME: Amitoj Bolich    MR#:  [id number]  DATE OF BIRTH:  11-Jul-1961  SUBJECTIVE:   - Patient admitted with weight loss and dysphagia. EGD done  showing distal esophageal stricture and dilatation of esophagus proximal to that. -Recommended nothing by mouth and parenteral nutrition at this time. -Patient quite pleasant today. TPN has been initiated after PICC line was placed last evening.  REVIEW OF SYSTEMS:  Review of Systems  Unable to perform ROS: Psychiatric disorder    DRUG ALLERGIES:   Allergies  Allergen Reactions  . Haldol [Haloperidol Lactate] Other (See Comments)    Shaking uncontrollable   . Ampicillin   . Depakote [Divalproex Sodium]   . Lamictal [Lamotrigine]   . Penicillins Other (See Comments)    Reaction unknown-childhood allergy    VITALS:  Blood pressure 96/65, pulse (!) 56, temperature 97.6 F (36.4 C), resp. rate 16, height 5\' 10"  (1.778 m), weight 45.1 kg (99 lb 8 oz), SpO2 99 %.  PHYSICAL EXAMINATION:  Physical Exam  GENERAL:  56 y.o.-year-old severely malnourished patient lying in the bed with no acute distress.  EYES: Pupils equal, round, reactive to light and accommodation. No scleral icterus. Extraocular muscles intact.  HEENT: Head atraumatic, normocephalic. Oropharynx and nasopharynx clear.  NECK:  Supple, no jugular venous distention. No thyroid enlargement, no tenderness.  LUNGS: Normal breath sounds bilaterally, no wheezing, rales,rhonchi or crepitation. No use of accessory muscles of respiration.  CARDIOVASCULAR: S1, S2 normal. No murmurs, rubs, or gallops.  ABDOMEN: Soft, nontender, nondistended. Bowel sounds present. No organomegaly or mass.  EXTREMITIES: No pedal edema, cyanosis, or clubbing. Right upper extremity PICC line NEUROLOGIC: Cranial nerves II through XII are intact. Able to Move extremities in bed. Sensation intact, not following commands because he is very  irritable PSYCHIATRIC:  patient is alert and oriented. Very angry  SKIN: No obvious rash, lesion, or ulcer.    LABORATORY PANEL:   CBC  Recent Labs Lab 11/02/16 0415  WBC 9.9  HGB 13.0  HCT 37.6*  PLT 279   ------------------------------------------------------------------------------------------------------------------  Chemistries   Recent Labs Lab 11/03/16 0601  11/05/16 0625  NA 136  < > 136  K 3.9  < > 4.2  CL 107  < > 106  CO2 24  < > 26  GLUCOSE 86  < > 90  BUN 25*  < > 20  CREATININE 0.61  < > 0.57*  CALCIUM 8.6*  < > 8.6*  MG 1.7  < > 1.4*  AST 16  --   --   ALT 13*  --   --   ALKPHOS 53  --   --   BILITOT 0.9  --   --   < > = values in this interval not displayed. ------------------------------------------------------------------------------------------------------------------  Cardiac Enzymes  Recent Labs Lab 11/01/16 1244  TROPONINI 0.04*   ------------------------------------------------------------------------------------------------------------------  RADIOLOGY:  No results found.  EKG:   Orders placed or performed during the hospital encounter of 11/01/16  . ED EKG  . ED EKG    ASSESSMENT AND PLAN:   56 year old male with past medical history paranoid schizophrenia, COPD, hyperlipidemia, who presents to the hospital due to ongoing weight loss, dysphagia and noted to have an upper GI series concerning for a high-grade esophageal stricture concerning for neoplasm.  1. Dysphagia-patient had an upper GI series which shows a high-grade esophageal stricture with bx shwoing Adenocarcinoma of the esophageal  -Weight loss, dysphagia and  nausea vomiting going on severely for the last 3 months at least. -EGD done showing severe achalasia and a distal esophageal complete stricture/obstruction. Biopsies done. -Appreciate GI consult. Unable to pass the probe through endoscopy currently. --Consulted interventional radiologist Dr. Kathlene Cote on call,  because his stomach is so shrunken, and has hiatal hernia, unable to place a G-tube at this time. -Is and will be nothing by mouth, changes oral medications to other routes. -Dietitian consult for TPN started on 11/04/16 -Discussed with surgery who recommends patient be transferred to tertiary care center given complexity of the case and unable to handle here. This was discussed with Dr. Azalee Course -Not successful for transferring pt to tertiary care center due to different reasons. -Dr Grayland Ormond to see pt -A long discussion with patient's healthcare power of attorney Jaynie Bream ray and patient has [name] also listed as healthcare power of attorney per Ms. Ray. Ms ray is agreeable to patient be transferred to any tertiary care center in the area -Right upper extremity PICC line placed on 11/03/2016. TPN started.  2. Acute kidney injury-secondary to dehydration. -on IV TPN  3. Hypokalemia-secondary to poor by mouth intake and dehydration. -on supplements   4. Adult Failure to thrive - due to possible malignancy and ongoing dysphagia.  Biopsy results in 3 days. Then may be oncology/palliative consults  5. Hyperlipidemia - cont. Crestor.   6. COPD - no acute exacerbation.  - cont. Albuterol inhaler PRN  7. Paranoid Schizophrenia - patient is very angry, likely was not absorbing his meds for a while now Psych consult appreciated. Per Dr clapacs "As far as capacity, patients with schizophrenia can be something of a challenge. He most likely has the cognitive ability to comprehend but any proposed procedures will have to be presented to him in the most concrete straightforward manner with communication of worst case consequences if he refuses emphasized and at the same time this should be done with a minimum of emotional drama "  8. DVT prophylaxis-on Lovenox  Spoke with [name] on the phone. I will initiate the transfer process once have the biopsy results back  All the records are  reviewed and case discussed with Care Management/Social Workerr. Management plans discussed with the patient, family and they are in agreement.  CODE STATUS: Full code  TOTAL TIME TAKING CARE OF THIS PATIENT: 35 minutes.   POSSIBLE D/C ? DAYS, DEPENDING ON CLINICAL CONDITION.   Roxy Mastandrea M.D on 11/05/2016 at 8:02 PM  Between 7am to 6pm - Pager - [phone]  After 6pm go to [email or web address] - password [email or web address] [facility]  Office  [phone]  CC: Primary care physician; No PCP Per Patient

## 2016-11-05 NOTE — Progress Notes (Signed)
The Nurse called [facility] and asked him to visit the Pt in Rm 237 who was angry with the poor prognosis he received. Pt has oesophageus cancer and have not been eating or drinking. [place] me with Pt. Pt talked about his poor health, his interests in spiritual things, and playing guitar. Pt expressed hope to play guitar in church when he gets well. Pt requested [place] to pray for him and for the Dc's to find a hospital where his surgery would be performed. [facility] offered prayers and ministry of compassion and presence.       11/05/16 1500  Clinical Encounter Type  Visited With Patient  Visit Type Initial;Spiritual support;Other (Comment)  Referral From Nurse  Consult/Referral To Nurse  Spiritual Encounters  Spiritual Needs Prayer;Other (Comment)

## 2016-11-06 DIAGNOSIS — R200 Anesthesia of skin: Secondary | ICD-10-CM

## 2016-11-06 DIAGNOSIS — R112 Nausea with vomiting, unspecified: Secondary | ICD-10-CM

## 2016-11-06 DIAGNOSIS — Z794 Long term (current) use of insulin: Secondary | ICD-10-CM

## 2016-11-06 DIAGNOSIS — R634 Abnormal weight loss: Secondary | ICD-10-CM

## 2016-11-06 DIAGNOSIS — C159 Malignant neoplasm of esophagus, unspecified: Principal | ICD-10-CM

## 2016-11-06 DIAGNOSIS — J449 Chronic obstructive pulmonary disease, unspecified: Secondary | ICD-10-CM

## 2016-11-06 DIAGNOSIS — E46 Unspecified protein-calorie malnutrition: Secondary | ICD-10-CM

## 2016-11-06 DIAGNOSIS — K222 Esophageal obstruction: Secondary | ICD-10-CM

## 2016-11-06 DIAGNOSIS — Z79899 Other long term (current) drug therapy: Secondary | ICD-10-CM

## 2016-11-06 LAB — PHOSPHORUS: PHOSPHORUS: 2.8 mg/dL (ref 2.5–4.6)

## 2016-11-06 LAB — BASIC METABOLIC PANEL
ANION GAP: 5 (ref 5–15)
BUN: 18 mg/dL (ref 6–20)
CHLORIDE: 106 mmol/L (ref 101–111)
CO2: 24 mmol/L (ref 22–32)
CREATININE: 0.46 mg/dL — AB (ref 0.61–1.24)
Calcium: 8.3 mg/dL — ABNORMAL LOW (ref 8.9–10.3)
GFR calc non Af Amer: >60 mL/min (ref >60)
Glucose, Bld: 94 mg/dL (ref 65–99)
Potassium: 4 mmol/L (ref 3.5–5.1)
SODIUM: 135 mmol/L (ref 135–145)

## 2016-11-06 LAB — GLUCOSE, CAPILLARY
GLUCOSE-CAPILLARY: 74 mg/dL (ref 65–99)
GLUCOSE-CAPILLARY: 85 mg/dL (ref 65–99)
GLUCOSE-CAPILLARY: 97 mg/dL (ref 65–99)
Glucose-Capillary: 111 mg/dL — ABNORMAL HIGH (ref 65–99)
Glucose-Capillary: 100 mg/dL — ABNORMAL HIGH (ref 65–99)
Glucose-Capillary: 101 mg/dL — ABNORMAL HIGH (ref 65–99)
Glucose-Capillary: 109 mg/dL — ABNORMAL HIGH (ref 65–99)

## 2016-11-06 LAB — MAGNESIUM: MAGNESIUM: 1.7 mg/dL (ref 1.7–2.4)

## 2016-11-06 MED ORDER — FAT EMULSION 20 % IV EMUL
240.0000 mL | INTRAVENOUS | Status: AC
  Administered 2016-11-06: 240 mL via INTRAVENOUS
  Filled 2016-11-06: qty 250

## 2016-11-06 MED ORDER — MAGNESIUM SULFATE IN D5W 1-5 GM/100ML-% IV SOLN
1.0000 g | Freq: Once | INTRAVENOUS | Status: AC
  Administered 2016-11-06: 16:00:00 1 g via INTRAVENOUS
  Filled 2016-11-06: qty 100

## 2016-11-06 MED ORDER — TRACE MINERALS CR-CU-MN-SE-ZN 10-1000-500-60 MCG/ML IV SOLN
INTRAVENOUS | Status: DC
  Administered 2016-11-06: 18:00:00 via INTRAVENOUS
  Filled 2016-11-06: qty 1680

## 2016-11-06 MED ORDER — INSULIN ASPART 100 UNIT/ML ~~LOC~~ SOLN: 0.0000 [IU] | Freq: Four times a day (QID) | SUBCUTANEOUS | Status: DC

## 2016-11-06 NOTE — Progress Notes (Addendum)
Nutrition Follow-up  DOCUMENTATION CODES:   Severe malnutrition in context of chronic illness  INTERVENTION:  1. Increase 5%AA/15% dextrose to goal rate of 74mL/hr with electrolytes added. 2. Run 20% IVLE @ 42mL/hr, provides 480 calories Together provides:  1673 calories, 84gm protein, and 1680cc fluid  NUTRITION DIAGNOSIS:   Malnutrition related to chronic illness, dysphagia as evidenced by severe depletion of body fat, severe depletion of muscle mass, percent weight loss. -ongoing  GOAL:   Patient will meet greater than or equal to 90% of their needs -will meet with goal  MONITOR:   Labs, Weight trends, I & O's, Other (Comment) (TPN advancement )  ASSESSMENT:   56 year old male with past medical history paranoid schizophrenia, COPD, hyperlipidemia, who presents to the hospital due to ongoing weight loss, dysphagia and noted to have an upper GI series concerning for a high-grade esophageal stricture concerning for neoplasm.  Continues to tolerate TPN, recommend advance to goal today Was evaluated by surgery - recommends transffering patient to tertiary care center Has been a few days since he has had a BM No plans for feeding tube placement at this time. Labs and medications reviewed  Diet Order:  Diet NPO time specified TPN [facility] 5/15) Adult with electrolyte additives  Skin:  Wound (see comment) (Stage I coccyx)  Last BM:  1/19  Height:   Ht Readings from Last 1 Encounters:  11/02/16 5\' 10"  (1.778 m)    Weight:   Wt Readings from Last 1 Encounters:  11/06/16 102 lb 11.2 oz (46.6 kg)    Ideal Body Weight:  75.4 kg  BMI:  Body mass index is 14.74 kg/m.  Estimated Nutritional Needs:   Kcal:  1500-1700kcal/day   Protein:  80-95g/day   Fluid:  >5L/day   EDUCATION NEEDS:   Education needs no appropriate at this time  Satira Anis. Natia Fahmy, MS, RD LDN Inpatient Clinical Dietitian Pager [phone]

## 2016-11-06 NOTE — Consult Note (Signed)
[place]  [phone] [phone]  ID: Roberto Davis OB: Sep 01, 1961  MR#: [id number]  [id number]  Patient Care Team: No Pcp Per Patient as PCP - General (General Practice) Lucilla Lame, MD as Consulting Physician (Gastroenterology)  CHIEF COMPLAINT: Locally advanced esophageal cancer, schizophrenia.  INTERVAL HISTORY: Patient is a 56 year old male with severe schizophrenia who was recently admitted with significant weight loss and malnutrition. He was found to have a nearly fully obstructing esophageal stricture confirmed to be malignancy. Patient is currently on TPN for nutritional support, but ultimately will require feeding tube. Patient's history is difficult given his underlying schizophrenia. He has no neurologic complaints. He denies any recent fevers. He denies any chest pain or shortness of breath. He has significant weight loss and is unable to tolerate PO intake. He denies any constipation or diarrhea. He does not complain of pain currently. He has no urinary complaints. Patient offers no further specific complaints.  REVIEW OF SYSTEMS:   Review of Systems  Constitutional: Positive for weight loss. Negative for fever and malaise/fatigue.  Respiratory: Negative for cough.   Cardiovascular: Negative.  Negative for chest pain.  Gastrointestinal: Positive for nausea and vomiting. Negative for abdominal pain, blood in stool, constipation, diarrhea and melena.  Genitourinary: Negative.   Musculoskeletal: Negative.   Neurological: Negative.  Negative for weakness.  Psychiatric/Behavioral: Negative.     As per HPI. Otherwise, a complete review of systems is negative.  PAST MEDICAL HISTORY: Past Medical History:  Diagnosis Date  . COPD (chronic obstructive pulmonary disease) ([place])   . High cholesterol   . Paranoid schizophrenia ([place])     PAST SURGICAL HISTORY: Past Surgical History:  Procedure Laterality Date  . DG BARIUM  SWALLOW ([place] HX)    . ESOPHAGOGASTRODUODENOSCOPY (EGD) WITH PROPOFOL N/A 10/25/2016   Procedure: ESOPHAGOGASTRODUODENOSCOPY (EGD) WITH PROPOFOL;  Surgeon: Jonathon Bellows, MD;  Location: [facility] ENDOSCOPY;  Service: Endoscopy;  Laterality: N/A;  . ESOPHAGOGASTRODUODENOSCOPY (EGD) WITH PROPOFOL N/A 11/02/2016   Procedure: ESOPHAGOGASTRODUODENOSCOPY (EGD) WITH PROPOFOL;  Surgeon: Lucilla Lame, MD;  Location: [facility] ENDOSCOPY;  Service: Endoscopy;  Laterality: N/A;  . HAND SURGERY      FAMILY HISTORY: Family History  Problem Relation Age of Onset  . Heart failure Mother     ADVANCED DIRECTIVES (Y/N):  @ADVDIR @  HEALTH MAINTENANCE: Social History  Substance Use Topics  . Smoking status: Current Every Day Smoker    Packs/day: 0.25    Years: 40.00    Types: Cigarettes  . Smokeless tobacco: Never Used  . Alcohol use No     Colonoscopy:  PAP:  Bone density:  Lipid panel:  Allergies  Allergen Reactions  . Haldol [Haloperidol Lactate] Other (See Comments)    Shaking uncontrollable   . Ampicillin   . Depakote [Divalproex Sodium]   . Lamictal [Lamotrigine]   . Penicillins Other (See Comments)    Reaction unknown-childhood allergy    Current Facility-Administered Medications  Medication Dose Route Frequency Provider Last Rate Last Dose  . acetaminophen (TYLENOL) tablet 650 mg  650 mg Oral Q6H PRN Henreitta Leber, MD       Or  . acetaminophen (TYLENOL) suppository 650 mg  650 mg Rectal Q6H PRN Henreitta Leber, MD   650 mg at 11/02/16 2317  . albuterol (PROVENTIL) (2.5 MG/3ML) 0.083% nebulizer solution 3 mL  3 mL Inhalation Q6H PRN Henreitta Leber, MD      . bisacodyl (DULCOLAX) suppository 10 mg  10 mg Rectal Daily PRN  Gladstone Lighter, MD   10 mg at 11/02/16 2236  . chlorhexidine (PERIDEX) 0.12 % solution 15 mL  15 mL Mouth Rinse BID Henreitta Leber, MD   15 mL at 11/05/16 1000  . Chlorhexidine Gluconate Cloth 2 % PADS 6 each  6 each Topical Q0600 Henreitta Leber, MD   6 each at 11/06/16  0745  . enoxaparin (LOVENOX) injection 30 mg  30 mg Subcutaneous QHS Henreitta Leber, MD   30 mg at 11/05/16 2153  . insulin aspart (novoLOG) injection 0-9 Units  0-9 Units Subcutaneous Q6H Gladstone Lighter, MD      . iopamidol (ISOVUE-300) 61 % injection 30 mL  30 mL Oral Once PRN Earleen Newport, MD      . LORazepam (ATIVAN) injection 1 mg  1 mg Intravenous Q6H PRN Gladstone Lighter, MD   1 mg at 11/05/16 1258  . LORazepam (ATIVAN) injection 2 mg  2 mg Intravenous QHS Gonzella Lex, MD   2 mg at 11/05/16 2154  . MEDLINE mouth rinse  15 mL Mouth Rinse q12n4p Henreitta Leber, MD   15 mL at 11/06/16 1144  . mupirocin ointment (BACTROBAN) 2 % 1 application  1 application Nasal BID Henreitta Leber, MD   1 application at [id number]  . OLANZapine zydis (ZYPREXA) disintegrating tablet 10 mg  10 mg Oral QHS Gonzella Lex, MD   10 mg at 11/05/16 2154  . ondansetron (ZOFRAN) tablet 4 mg  4 mg Oral Q6H PRN Henreitta Leber, MD       Or  . ondansetron (ZOFRAN) injection 4 mg  4 mg Intravenous Q6H PRN Henreitta Leber, MD      . ondansetron (ZOFRAN) tablet 4 mg  4 mg Oral Q4H PRN Henreitta Leber, MD      . phenol (CHLORASEPTIC) mouth spray 1 spray  1 spray Mouth/Throat PRN Gladstone Lighter, MD   1 spray at 11/04/16 0937  . pneumococcal 23 valent vaccine (PNU-IMMUNE) injection 0.5 mL  0.5 mL Intramuscular Prior to discharge Henreitta Leber, MD      . sodium chloride flush (NS) 0.9 % injection 10-40 mL  10-40 mL Intracatheter Q12H Fritzi Mandes, MD   10 mL at 11/05/16 2154  . sodium chloride flush (NS) 0.9 % injection 10-40 mL  10-40 mL Intracatheter PRN Fritzi Mandes, MD      . sodium chloride flush (NS) 0.9 % injection 3 mL  3 mL Intravenous Q12H Henreitta Leber, MD   3 mL at 11/05/16 2154  . TPN (CLINIMIX 5/15) Adult with electrolyte additives   Intravenous Continuous TPN Fritzi Mandes, MD 40 mL/hr at 11/06/16 0900      OBJECTIVE: Vitals:   11/06/16 0813 11/06/16 1300  BP: 103/60 100/64  Pulse: 64 (!)  47  Resp: 18   Temp: 97.9 F (36.6 C) 97.7 F (36.5 C)     Body mass index is 14.74 kg/m.    ECOG FS:1 - Symptomatic but completely ambulatory  General: Thin, no acute distress. Eyes: Pink conjunctiva, anicteric sclera. HEENT: Normocephalic, moist mucous membranes, clear oropharnyx. Lungs: Clear to auscultation bilaterally. Heart: Regular rate and rhythm. No rubs, murmurs, or gallops. Abdomen: Soft, nontender, nondistended. No organomegaly noted, normoactive bowel sounds. Musculoskeletal: No edema, cyanosis, or clubbing. Neuro: Alert, answering all questions appropriately. Cranial nerves grossly intact. Skin: No rashes or petechiae noted. Psych: Normal affect. Lymphatics: No cervical, calvicular, axillary or inguinal LAD.   LAB RESULTS:  Lab Results  Component Value Date   NA 135 11/06/2016   K 4.0 11/06/2016   CL 106 11/06/2016   CO2 24 11/06/2016   GLUCOSE 94 11/06/2016   BUN 18 11/06/2016   CREATININE 0.46 (L) 11/06/2016   CALCIUM 8.3 (L) 11/06/2016   PROT 5.4 (L) 11/03/2016   ALBUMIN 3.0 (L) 11/03/2016   AST 16 11/03/2016   ALT 13 (L) 11/03/2016   ALKPHOS 53 11/03/2016   BILITOT 0.9 11/03/2016   GFRNONAA >60 11/06/2016   GFRAA >60 11/06/2016    Lab Results  Component Value Date   WBC 9.9 11/02/2016   NEUTROABS 8.5 (H) 11/01/2016   HGB 13.0 11/02/2016   HCT 37.6 (L) 11/02/2016   MCV 88.8 11/02/2016   PLT 279 11/02/2016     STUDIES: Ct Chest W Contrast  Result Date: 11/01/2016 CLINICAL DATA:  Distal esophageal stricture on esophagram. Weight loss EXAM: CT CHEST, ABDOMEN, AND PELVIS WITH CONTRAST TECHNIQUE: Multidetector CT imaging of the chest, abdomen and pelvis was performed following the standard protocol during bolus administration of intravenous contrast. CONTRAST:  70mL ISOVUE-300 IOPAMIDOL (ISOVUE-300) INJECTION 61%, 1 ISOVUE-300 IOPAMIDOL (ISOVUE-300) INJECTION 61% COMPARISON:  Esophagram 11/01/2016 FINDINGS: CT CHEST FINDINGS Cardiovascular: No  significant vascular findings. Normal heart size. No pericardial effusion. Mediastinum/Nodes: No axillary or supraclavicular adenopathy. The distal esophagus is patulous and distended to 5.7 cm. There is some retained barium from esophagram 1 day prior. There is circumferential narrowing of esophagus over a 3 cm segment leading up to the GE junction. There is a hiatal hernia. Lungs/Pleura: There is mild ground-glass airspace opacities in LEFT lower lobe. There branching pattern. There is a material within the LEFT lower lobe bronchi. Findings suggest aspiration pneumonitis. Similar findings in the Right upper lobe laterally. Musculoskeletal: Very little body fat the thorax. No aggressive osseous lesion. CT ABDOMEN AND PELVIS FINDINGS Hepatobiliary: No focal hepatic lesion. No biliary ductal dilatation. Gallbladder is normal. Common bile duct is normal. Pancreas: Pancreas is normal. No ductal dilatation. No pancreatic inflammation. Spleen: Normal spleen Adrenals/urinary tract: Adrenal glands and kidneys are normal. The ureters and bladder normal. Stomach/Bowel: Moderate hiatal hernia noted. Distal esophageal stricture. Examination of the peritoneal space difficult due to very little body fat high-density barium from 1 day prior. No gross abnormality of the small bowel or colon. Vascular/Lymphatic: Abdominal aorta is normal caliber with atherosclerotic calcification. There is no retroperitoneal or periportal lymphadenopathy. No pelvic lymphadenopathy. Reproductive: Prostate is large Other: No free fluid. Musculoskeletal: No aggressive osseous lesion. IMPRESSION: Chest Impression: 1. Circumferential stricturing of the distal esophagus with high-grade obstruction. Differential would include esophageal neoplasm versus ACHALASIA. 2. Fine airspace disease in the LEFT lower lobe with endobronchial filling defects. Concern for aspiration pneumonitis Abdomen / Pelvis Impression: 1. Very little body fat within the peritoneal  space or subcutaneous tissue. 2. Evaluation of the peritoneal contents difficulty high-grade high-density barium. 3. No evidence of metastatic disease within the liver. No gross evidence of lymphadenopathy. Electronically Signed   By: Suzy Bouchard M.D.   On: 11/01/2016 14:45   Ct Abdomen Pelvis W Contrast  Result Date: 11/01/2016 CLINICAL DATA:  Distal esophageal stricture on esophagram. Weight loss EXAM: CT CHEST, ABDOMEN, AND PELVIS WITH CONTRAST TECHNIQUE: Multidetector CT imaging of the chest, abdomen and pelvis was performed following the standard protocol during bolus administration of intravenous contrast. CONTRAST:  109mL ISOVUE-300 IOPAMIDOL (ISOVUE-300) INJECTION 61%, 1 ISOVUE-300 IOPAMIDOL (ISOVUE-300) INJECTION 61% COMPARISON:  Esophagram 11/01/2016 FINDINGS: CT CHEST FINDINGS Cardiovascular: No significant vascular findings. Normal heart size. No  pericardial effusion. Mediastinum/Nodes: No axillary or supraclavicular adenopathy. The distal esophagus is patulous and distended to 5.7 cm. There is some retained barium from esophagram 1 day prior. There is circumferential narrowing of esophagus over a 3 cm segment leading up to the GE junction. There is a hiatal hernia. Lungs/Pleura: There is mild ground-glass airspace opacities in LEFT lower lobe. There branching pattern. There is a material within the LEFT lower lobe bronchi. Findings suggest aspiration pneumonitis. Similar findings in the Right upper lobe laterally. Musculoskeletal: Very little body fat the thorax. No aggressive osseous lesion. CT ABDOMEN AND PELVIS FINDINGS Hepatobiliary: No focal hepatic lesion. No biliary ductal dilatation. Gallbladder is normal. Common bile duct is normal. Pancreas: Pancreas is normal. No ductal dilatation. No pancreatic inflammation. Spleen: Normal spleen Adrenals/urinary tract: Adrenal glands and kidneys are normal. The ureters and bladder normal. Stomach/Bowel: Moderate hiatal hernia noted. Distal  esophageal stricture. Examination of the peritoneal space difficult due to very little body fat high-density barium from 1 day prior. No gross abnormality of the small bowel or colon. Vascular/Lymphatic: Abdominal aorta is normal caliber with atherosclerotic calcification. There is no retroperitoneal or periportal lymphadenopathy. No pelvic lymphadenopathy. Reproductive: Prostate is large Other: No free fluid. Musculoskeletal: No aggressive osseous lesion. IMPRESSION: Chest Impression: 1. Circumferential stricturing of the distal esophagus with high-grade obstruction. Differential would include esophageal neoplasm versus ACHALASIA. 2. Fine airspace disease in the LEFT lower lobe with endobronchial filling defects. Concern for aspiration pneumonitis Abdomen / Pelvis Impression: 1. Very little body fat within the peritoneal space or subcutaneous tissue. 2. Evaluation of the peritoneal contents difficulty high-grade high-density barium. 3. No evidence of metastatic disease within the liver. No gross evidence of lymphadenopathy. Electronically Signed   By: Suzy Bouchard M.D.   On: 11/01/2016 14:45   Dg Chest Port 1 View  Result Date: 11/03/2016 CLINICAL DATA:  PICC line EXAM: PORTABLE CHEST 1 VIEW COMPARISON:  11/01/2016, 07/18/2016 FINDINGS: Hyperinflation is present. No acute infiltrate or effusion. Normal heart size. Right-sided central venous catheter tip overlies the SVC. No pneumothorax. IMPRESSION: Right upper extremity catheter tip overlies the SVC. Hyperinflation without focal infiltrate Electronically Signed   By: Donavan Foil M.D.   On: 11/03/2016 20:04   Dg Ugi W/high Density W/kub  Result Date: 11/01/2016 CLINICAL DATA:  Significant weight loss over the last 3 months greater than 50 pounds. Unable to swallow solids or liquids. EXAM: UPPER GI SERIES WITH KUB TECHNIQUE: After obtaining a scout radiograph a routine upper GI series was performed using thin barium. FLUOROSCOPY TIME:  Fluoroscopy  Time:  42 seconds Radiation Exposure Index (if provided by the fluoroscopic device): 3.2 mGy Number of Acquired Spot Images: 0 COMPARISON:  None. FINDINGS: KUB: There is no bowel dilatation to suggest obstruction. There is no evidence of pneumoperitoneum, portal venous gas or pneumatosis. There are no pathologic calcifications along the expected course of the ureters. The osseous structures are unremarkable. Upper GI: The patient is unable to tolerate significant amounts of contrast. During the entire examination the patient was nauseous and continued to spit up saliva as well as any ingested barium. There is a high-grade stricture of the distal esophagus measuring 2.5 cm approximately in length. A small amount of contrast traverses the stricture. The esophagus proximal to the stricture is severely dilated with secretions. IMPRESSION: 1. High-grade stricture of the distal esophagus measuring approximately 2.5 cm in length. Only a trickle of contrast traverses the stricture. The stricture is concerning for an inflammatory or neoplastic etiology. Recommend further evaluation  with a CT of the chest and abdomen given the recently failed upper endoscopy. Electronically Signed   By: Kathreen Devoid   On: 11/01/2016 12:02    ASSESSMENT:  Locally advanced esophageal cancer, schizophrenia.  PLAN:    1.  Locally advanced esophageal cancer: Patient's significant malnutrition and weight loss is secondary to his malignancy and unable to take PO. Patient currently being nutritionally support with TPN, but this is not a long-term solution. Agree the patient needs feeding tube either G or J-tube. This may require transfer to a tertiary care facility if possible. Patient will ultimately require a PET scan as well as concurrent chemotherapy and XRT. Ideally, a feeding tube would be placed first for nutritional support. If this does not occur, patient may require a lengthy inpatient stay to initiate treatment. Cases been discussed  with Dr. Posey Pronto and Dr. Vicente Males.  2. Schizophrenia: Case also discussed with patient's power of attorney. He likely cannot return to the group home which he was living and likely will need long-term assisted living.   Patient expressed understanding and was in agreement with this plan. He also understands that He can call clinic at any time with any questions, concerns, or complaints.   Cancer Staging No matching staging information was found for the patient.  Lloyd Huger, MD   11/06/2016 2:04 PM

## 2016-11-06 NOTE — Clinical Social Work Note (Signed)
Clinical Social Work Assessment  Patient Details  Name: Roberto Davis MRN: [id number] Date of Birth: 16-Apr-1961  Date of referral:  11/06/16               Reason for consult:  Facility Placement                Permission sought to share information with:    Permission granted to share information::     Name::        Agency::     Relationship::     Contact Information:     Housing/Transportation Living arrangements for the past 2 months:  Group Home Source of Information:   (healthcare power of attorney: Roberto [id number] [phone]) Patient Interpreter Needed:  None Criminal Activity/Legal Involvement Pertinent to Current Situation/Hospitalization:  No - Comment as needed Significant Relationships:  Friend Lives with:  Facility Resident Do you feel safe going back to the place where you live?  Yes Need for family participation in patient care:  Yes (Comment)  Care giving concerns:  [facility] is a resident of [facility].   Social Worker assessment / plan:  CSW informed by MD that patient had transferred to [facility] unit from 2A late yesterday. MD stated that there was some confusion as to who she needed to speak with as initially the owner of the group home [facility] stated that she was the contact and then after a diagnosis of cancer was made, Roberto [facility] then informed the MD to contact [facility].   Roberto Davis called the floor and asked to speak with CSW. Roberto Davis stated that the appropriate HPOA paperwork was given to hospital staff over the weekend. CSW has verified this and obtained Roberto Davis's number and provided this to the attending MD. The MD is going to speak to Roberto Davis about the next course of action.   Employment status:  Disabled (Comment on whether or not currently receiving Disability) Insurance information:  Managed Medicare PT Recommendations:  Not assessed at this time Information / Referral to community resources:      Patient/Family's Response to care:  Roberto Davis expressed appreciation for CSW assistance.  Patient/Family's Understanding of and Emotional Response to Diagnosis, Current Treatment, and Prognosis:  Roberto Davis was relieved that he was able to contact someone who was able to put him in touch with the physician.   Emotional Assessment Appearance:  Appears stated age Attitude/Demeanor/Rapport:  Unable to Assess Affect (typically observed):  Unable to Assess Orientation:  Oriented to Self, Oriented to Place Alcohol / Substance use:  Not Applicable Psych involvement (Current and /or in the community):  Yes (Comment)  Discharge Needs  Concerns to be addressed:  Care Coordination Readmission within the last 30 days:  No Current discharge risk:  None Barriers to Discharge:  No Barriers Identified   Roberto Leff, Roberto Davis 11/06/2016, 10:41 AM

## 2016-11-06 NOTE — Consult Note (Signed)
[name]   Reason for Consult:  Consult for 56 year old man with a history of schizophrenia who is admitted to the hospital because of esophageal obstruction Referring Physician:  Tressia Miners Patient Identification: Roberto Davis MRN:  [id number] Principal Diagnosis: Paranoid schizophrenia [facility]) Diagnosis:   Patient Active Problem List   Diagnosis Date Noted  . Esophageal obstruction [K22.2]   . Pressure injury of skin [L89.90] 11/02/2016  . Protein-calorie malnutrition, severe [E43] 11/02/2016  . Acute renal failure ([place]) [N17.9] 11/01/2016  . Paranoid schizophrenia ([place]) [F20.0]   . High cholesterol [E78.00]   . COPD (chronic obstructive pulmonary disease) ([place]) [J44.9]     Total Time spent with patient: 20 minutes  Subjective:   Roberto Davis is a 56 y.o. male patient admitted with "I lost a lot of weight". Follow-up note for Tuesday the 23rd. Patient seen. Chart reviewed. Patient is complaining about being nothing by mouth. He claims that just before coming to the hospital he had been able to eat a bowl of cereal. He is requesting to be allowed to eat ice cream or something similar. I told him I could not make such a decision but I am placing a swallowing consult to see if that can possibly be done for his comfort.  HPI:  Patient interviewed. Chart reviewed. This is a 56 year old man with schizophrenia. Brought from his group home living facility for EGD to investigate abnormal barium swallow and difficulty swallowing. Patient's chief complaint is that he has lost a great deal of weight. He seems to indicate that perhaps the weight loss is even more than would be expected from his inability to swallow but his history is a little hard to follow. He says that he's been losing weight since last summer and has been having trouble swallowing since then too. He says recently it's gotten to where he throws up pretty much everything he eats. Patient says he  feels depressed because he looks so unusual. That seems to be his chief complaint around. He says otherwise his mood has been stable. Sleep has been okay. He says he has not been having any hallucinations. Totally denies suicidal or homicidal ideation. Current medications apparently are Zyprexa Zydis and Ativan at night only.    Social history: Patient is not married. He says his closest living relative is his adopted brother. He lives in a group home and has a legal guardian.  Medical history: History of COPD, elevated cholesterol, gastric reflux symptoms. No previously identified history of cancer that I can identify.  Substance abuse history: Denies any alcohol or drug abuse anytime recently. Not clear if he might of had some of the distant past.  Past Psychiatric History: Patient has a history of schizophrenia. He has had multiple hospitalizations throughout his life but we have not seen him psychiatrically at our hospital in over 2 years. He has been stable taking medication provided by his outpatient psychiatric provider. He does have a history of some agitation when emotionally distraught in the past. Does not have a history of serious injury to others.  Risk to Self: Is patient at risk for suicide?: No Risk to Others:   Prior Inpatient Therapy:   Prior Outpatient Therapy:    Past Medical History:  Past Medical History:  Diagnosis Date  . COPD (chronic obstructive pulmonary disease) ([place])   . High cholesterol   . Paranoid schizophrenia [facility])     Past Surgical History:  Procedure Laterality Date  . DG BARIUM SWALLOW [facility]  HX)    . ESOPHAGOGASTRODUODENOSCOPY (EGD) WITH PROPOFOL N/A 10/25/2016   Procedure: ESOPHAGOGASTRODUODENOSCOPY (EGD) WITH PROPOFOL;  Surgeon: Jonathon Bellows, MD;  Location: [facility] ENDOSCOPY;  Service: Endoscopy;  Laterality: N/A;  . ESOPHAGOGASTRODUODENOSCOPY (EGD) WITH PROPOFOL N/A 11/02/2016   Procedure: ESOPHAGOGASTRODUODENOSCOPY (EGD) WITH PROPOFOL;  Surgeon: Lucilla Lame, MD;  Location: [facility] ENDOSCOPY;  Service: Endoscopy;  Laterality: N/A;  . HAND SURGERY     Family History:  Family History  Problem Relation Age of Onset  . Heart failure Mother    Family Psychiatric  History: Patient is not aware of there being a family history Social History:  History  Alcohol Use No     History  Drug Use  . Types: Marijuana, "Crack" cocaine    Comment: Used to do Crack, Cocaine (quit 9 yrs ago)    Social History   Social History  . Marital status: Single    Spouse name: N/A  . Number of children: N/A  . Years of education: N/A   Social History Main Topics  . Smoking status: Current Every Day Smoker    Packs/day: 0.25    Years: 40.00    Types: Cigarettes  . Smokeless tobacco: Never Used  . Alcohol use No  . Drug use: Yes    Types: Marijuana, "Crack" cocaine     Comment: Used to do Crack, Cocaine (quit 9 yrs ago)  . Sexual activity: Not Asked   Other Topics Concern  . None   Social History Narrative  . None   Additional Social History:    Allergies:   Allergies  Allergen Reactions  . Haldol [Haloperidol Lactate] Other (See Comments)    Shaking uncontrollable   . Ampicillin   . Depakote [Divalproex Sodium]   . Lamictal [Lamotrigine]   . Penicillins Other (See Comments)    Reaction unknown-childhood allergy    Labs:  Results for orders placed or performed during the hospital encounter of 11/01/16 (from the past 48 hour(s))  Glucose, capillary     Status: Abnormal   Collection Time: 11/05/16 12:03 AM  Result Value Ref Range   Glucose-Capillary 101 (H) 65 - 99 mg/dL   Comment 1 Notify RN   Glucose, capillary     Status: None   Collection Time: 11/05/16  4:09 AM  Result Value Ref Range   Glucose-Capillary 87 65 - 99 mg/dL   Comment 1 Notify RN   Basic metabolic panel     Status: Abnormal   Collection Time: 11/05/16  6:25 AM  Result Value Ref Range   Sodium 136 135 - 145 mmol/L   Potassium 4.2 3.5 - 5.1 mmol/L   Chloride 106  101 - 111 mmol/L   CO2 26 22 - 32 mmol/L   Glucose, Bld 90 65 - 99 mg/dL   BUN 20 6 - 20 mg/dL   Creatinine, Ser 0.57 (L) 0.61 - 1.24 mg/dL   Calcium 8.6 (L) 8.9 - 10.3 mg/dL   GFR calc non Af Amer >60 >60 mL/min   GFR calc Af Amer >60 >60 mL/min    Comment: (NOTE) The eGFR has been calculated using the CKD EPI equation. This calculation has not been validated in all clinical situations. eGFR's persistently <60 mL/min signify possible Chronic Kidney Disease.    Anion gap 4 (L) 5 - 15  Magnesium     Status: Abnormal   Collection Time: 11/05/16  6:25 AM  Result Value Ref Range   Magnesium 1.4 (L) 1.7 - 2.4 mg/dL  Phosphorus  Status: Abnormal   Collection Time: 11/05/16  6:25 AM  Result Value Ref Range   Phosphorus 2.3 (L) 2.5 - 4.6 mg/dL  Glucose, capillary     Status: None   Collection Time: 11/05/16  7:40 AM  Result Value Ref Range   Glucose-Capillary 94 65 - 99 mg/dL   Comment 1 Notify RN    Comment 2 Document in Chart   Glucose, capillary     Status: Abnormal   Collection Time: 11/05/16 12:53 PM  Result Value Ref Range   Glucose-Capillary 100 (H) 65 - 99 mg/dL   Comment 1 Notify RN    Comment 2 Document in Chart   Glucose, capillary     Status: Abnormal   Collection Time: 11/05/16  5:21 PM  Result Value Ref Range   Glucose-Capillary 105 (H) 65 - 99 mg/dL   Comment 1 Notify RN    Comment 2 Document in Chart   Glucose, capillary     Status: None   Collection Time: 11/05/16  9:18 PM  Result Value Ref Range   Glucose-Capillary 88 65 - 99 mg/dL  Glucose, capillary     Status: None   Collection Time: 11/06/16 12:20 AM  Result Value Ref Range   Glucose-Capillary 74 65 - 99 mg/dL  Glucose, capillary     Status: Abnormal   Collection Time: 11/06/16  1:05 AM  Result Value Ref Range   Glucose-Capillary 111 (H) 65 - 99 mg/dL  Basic metabolic panel     Status: Abnormal   Collection Time: 11/06/16  4:17 AM  Result Value Ref Range   Sodium 135 135 - 145 mmol/L    Potassium 4.0 3.5 - 5.1 mmol/L   Chloride 106 101 - 111 mmol/L   CO2 24 22 - 32 mmol/L   Glucose, Bld 94 65 - 99 mg/dL   BUN 18 6 - 20 mg/dL   Creatinine, Ser 0.46 (L) 0.61 - 1.24 mg/dL   Calcium 8.3 (L) 8.9 - 10.3 mg/dL   GFR calc non Af Amer >60 >60 mL/min   GFR calc Af Amer >60 >60 mL/min    Comment: (NOTE) The eGFR has been calculated using the CKD EPI equation. This calculation has not been validated in all clinical situations. eGFR's persistently <60 mL/min signify possible Chronic Kidney Disease.    Anion gap 5 5 - 15  Phosphorus     Status: None   Collection Time: 11/06/16  4:17 AM  Result Value Ref Range   Phosphorus 2.8 2.5 - 4.6 mg/dL  Magnesium     Status: None   Collection Time: 11/06/16  4:17 AM  Result Value Ref Range   Magnesium 1.7 1.7 - 2.4 mg/dL  Glucose, capillary     Status: Abnormal   Collection Time: 11/06/16  5:34 AM  Result Value Ref Range   Glucose-Capillary 100 (H) 65 - 99 mg/dL  Glucose, capillary     Status: Abnormal   Collection Time: 11/06/16  7:38 AM  Result Value Ref Range   Glucose-Capillary 101 (H) 65 - 99 mg/dL  Glucose, capillary     Status: None   Collection Time: 11/06/16 11:24 AM  Result Value Ref Range   Glucose-Capillary 97 65 - 99 mg/dL  Glucose, capillary     Status: Abnormal   Collection Time: 11/06/16  4:43 PM  Result Value Ref Range   Glucose-Capillary 109 (H) 65 - 99 mg/dL    Current Facility-Administered Medications  Medication Dose Route Frequency Provider Last Rate Last Dose  .  acetaminophen (TYLENOL) tablet 650 mg  650 mg Oral Q6H PRN Henreitta Leber, MD       Or  . acetaminophen (TYLENOL) suppository 650 mg  650 mg Rectal Q6H PRN Henreitta Leber, MD   650 mg at 11/02/16 2317  . albuterol (PROVENTIL) (2.5 MG/3ML) 0.083% nebulizer solution 3 mL  3 mL Inhalation Q6H PRN Henreitta Leber, MD      . bisacodyl (DULCOLAX) suppository 10 mg  10 mg Rectal Daily PRN Gladstone Lighter, MD   10 mg at 11/02/16 2236  .  chlorhexidine (PERIDEX) 0.12 % solution 15 mL  15 mL Mouth Rinse BID Henreitta Leber, MD   15 mL at 11/05/16 1000  . Chlorhexidine Gluconate Cloth 2 % PADS 6 each  6 each Topical Q0600 Henreitta Leber, MD   6 each at 11/06/16 0745  . enoxaparin (LOVENOX) injection 30 mg  30 mg Subcutaneous QHS Henreitta Leber, MD   30 mg at 11/05/16 2153  . fat emulsion 20 % infusion 240 mL  240 mL Intravenous Continuous TPN Gladstone Lighter, MD 20 mL/hr at 11/06/16 1725 240 mL at 11/06/16 1725  . insulin aspart (novoLOG) injection 0-9 Units  0-9 Units Subcutaneous Q6H Gladstone Lighter, MD      . iopamidol (ISOVUE-300) 61 % injection 30 mL  30 mL Oral Once PRN Earleen Newport, MD      . LORazepam (ATIVAN) injection 1 mg  1 mg Intravenous Q6H PRN Gladstone Lighter, MD   1 mg at 11/05/16 1258  . LORazepam (ATIVAN) injection 2 mg  2 mg Intravenous QHS Gonzella Lex, MD   2 mg at 11/05/16 2154  . MEDLINE mouth rinse  15 mL Mouth Rinse q12n4p Henreitta Leber, MD   15 mL at 11/06/16 1144  . mupirocin ointment (BACTROBAN) 2 % 1 application  1 application Nasal BID Henreitta Leber, MD   1 application at 54/09/81 2154  . OLANZapine zydis (ZYPREXA) disintegrating tablet 10 mg  10 mg Oral QHS Gonzella Lex, MD   10 mg at 11/05/16 2154  . ondansetron (ZOFRAN) tablet 4 mg  4 mg Oral Q6H PRN Henreitta Leber, MD       Or  . ondansetron (ZOFRAN) injection 4 mg  4 mg Intravenous Q6H PRN Henreitta Leber, MD      . ondansetron (ZOFRAN) tablet 4 mg  4 mg Oral Q4H PRN Henreitta Leber, MD      . phenol (CHLORASEPTIC) mouth spray 1 spray  1 spray Mouth/Throat PRN Gladstone Lighter, MD   1 spray at 11/04/16 0937  . pneumococcal 23 valent vaccine (PNU-IMMUNE) injection 0.5 mL  0.5 mL Intramuscular Prior to discharge Henreitta Leber, MD      . sodium chloride flush (NS) 0.9 % injection 10-40 mL  10-40 mL Intracatheter Q12H Fritzi Mandes, MD   10 mL at 11/05/16 2154  . sodium chloride flush (NS) 0.9 % injection 10-40 mL  10-40 mL  Intracatheter PRN Fritzi Mandes, MD      . sodium chloride flush (NS) 0.9 % injection 3 mL  3 mL Intravenous Q12H Henreitta Leber, MD   3 mL at 11/05/16 2154  . TPN (CLINIMIX 5/15) Adult with electrolyte additives   Intravenous Continuous TPN Gladstone Lighter, MD 70 mL/hr at 11/06/16 1732      Musculoskeletal: Strength & Muscle Tone: decreased and atrophy Gait & Station: unsteady Patient leans: N/A  Psychiatric Specialty Exam: Physical Exam  Nursing  note and vitals reviewed. Constitutional: He appears cachectic. No distress.  HENT:  Head: Normocephalic and atraumatic.  Eyes: Conjunctivae are normal. Pupils are equal, round, and reactive to light.  Neck: Normal range of motion.  Cardiovascular: Regular rhythm.   Respiratory: Effort normal. No respiratory distress.  GI: Soft.  Musculoskeletal: Normal range of motion.  Neurological: He is alert.  Skin: Skin is warm and dry.  Psychiatric: His speech is normal and behavior is normal. His affect is blunt. Thought content is not delusional. Cognition and memory are impaired. He expresses impulsivity. He expresses no homicidal and no suicidal ideation.    Review of Systems  Constitutional: Positive for malaise/fatigue and weight loss.  HENT: Negative.   Eyes: Negative.   Respiratory: Negative.   Cardiovascular: Negative.   Gastrointestinal: Positive for nausea and vomiting.  Musculoskeletal: Negative.   Skin: Negative.   Neurological: Positive for weakness.  Psychiatric/Behavioral: Positive for depression. Negative for hallucinations, memory loss, substance abuse and suicidal ideas. The patient is nervous/anxious. The patient does not have insomnia.     Blood pressure 100/64, pulse (!) 47, temperature 97.7 F (36.5 C), temperature source Oral, resp. rate 18, height '5\' 10"'$  (1.778 m), weight 46.6 kg (102 lb 11.2 oz), SpO2 100 %.Body mass index is 14.74 kg/m.  General Appearance: Disheveled  Eye Contact:  Fair  Speech:  Slow  Volume:   Decreased  Mood:  Dysphoric  Affect:  Blunt and Congruent  Thought Process:  Disorganized  Orientation:  Full (Time, Place, and Person)  Thought Content:  Rumination and Tangential  Suicidal Thoughts:  No  Homicidal Thoughts:  No  Memory:  Immediate;   Good Recent;   Fair Remote;   Fair  Judgement:  Impaired  Insight:  Fair  Psychomotor Activity:  Decreased  Concentration:  Concentration: Fair  Recall:  [employer] of Knowledge:  Fair  Language:  Fair  Akathisia:  No  Handed:  Right  AIMS (if indicated):     Assets:  Desire for Improvement Housing Resilience Social Support  ADL's:  Impaired  Cognition:  Impaired,  Mild  Sleep:        Treatment Plan Summary: Daily contact with patient to assess and evaluate symptoms and progress in treatment, Medication management and Plan Patient was schizophrenia. Clear from the other notes that his manner of thinking can sometimes be frustrating to other providers. My impression is that he does understand his condition and very much wants to get appropriate treatment but that sometimes his cognitive style may make it seem like he is being uncooperative. I spent some time empathizing with his desire to eat and his discomfort. I am placing a speech and swallowing consult in the hope that perhaps a safe experiment can be done to see if he can eat or drink some soft food for his own comfort and pleasure. No change to psychiatric medicine. I will continue to follow-up as needed. additionally, I believe he has a legal guardian. This should probably be confirmed because if that is the case that person would have to give consent for surgical treatment.  Disposition: Patient does not meet criteria for psychiatric inpatient admission. Supportive therapy provided about ongoing stressors.  Alethia Berthold, MD 11/06/2016 8:27 PM

## 2016-11-06 NOTE — Progress Notes (Signed)
  Revisited with patient and discussed with health care power of attorney about placement of feeding tube and possible placement of Chemo-Port.  Patient became very emotional when I mentioned the need for a feeding tube and became very adamant that he did not want one right now. After a prolonged conversation I discussed that it did not have to be done today and he became calm once again.  Discussed with the power of attorney about the possibility of both procedures and he voiced understanding and agreement that they should be done. Also discussed that it would likely be Thursday or Friday of this week prior to be done. If it appears that he will be transferred to a tertiary care center before then I would not delay his transfer to perform the procedures as they could be performed at the receiving hospital.  Plan to revisit with the patient tomorrow and likely placed on the OR schedule for at least the feeding J-tube, possibly both the J-tube and the Chemo-Port at that time.  Roberto Pert, MD [facility]

## 2016-11-06 NOTE — Progress Notes (Signed)
[facility] NOTE   Pharmacy Consult for TPN  Indication: failure to thrive  Patient Measurements: Height: 5\' 10"  (177.8 cm) Weight: 99 lb 6.9 oz (45.1 kg) IBW/kg (Calculated) : 73 TPN AdjBW (KG): 42.4 Body mass index is 14.27 kg/m.  Assessment:  70 yoM with a hx of COPD and paranoid schizophrenia. Patient has severe cachexia, failure to thrive, and esophageal obstruction.   Pt currently NPO Current orders for TPN Clinimix 5/15 with electrolyte additives at a rate of 40 mL/hr.  BG 74-111: Insulin requirements in the past 24 hours: 0 unit Lytes: K = 4.0, Mg = 1.7, Phos = 2.8 - WNL  Plan:  Increase TPN Clinimix 5/15 with electrolyte additives to goal rate of 70 mL/hr per dietician recommendations. Start fat emulsion 20% infusion - 20 ml/hr for 12 hr.  Will order mag sulfate 1 g IV x1.  Recheck electrolytes with AM labs.  SSI ordered q4h, pt requiring no insulin, will decrease SSI frequency to q6h to match CBG checks.   Thank you for this consult.  Rocky Morel, PharmD, BCPS Clinical Pharmacist 11/06/2016

## 2016-11-06 NOTE — Consult Note (Signed)
Patient ID: Roberto Davis, male   DOB: [birth year], 56 y.o.   MRN: [id number]  CC: Esophageal Cancer  HPI Roberto Davis is a 56 y.o. male who is currently admitted to the medicine service due to a high-grade esophageal stricture and weakness. 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 Surgery was consuls up by Dr. Posey Pronto for evaluation of possible feeding tube access. Patient has paranoid schizophrenia and very little insight into his disease currently. Upon questioning he is able to tell me that he has cancer of his esophagus. Patient then goes into the diatribe about being able to be and constipation requiring suppositories. His emotions are very labile and at the end of our visit talked about how unhappy is to hear the news that he has cancer and that he does not wish to be in the hospital 1 minute longer than he has to be. He denies any fevers, chills, nausea, vomiting, chest pain, shortness of breath. He states he's had constipation but none currently.  HPI  Past Medical History:  Diagnosis Date  . COPD (chronic obstructive pulmonary disease) ([place])   . High cholesterol   . Paranoid schizophrenia [facility])     Past Surgical History:  Procedure Laterality Date  . DG BARIUM SWALLOW (Roberto Davis)    . ESOPHAGOGASTRODUODENOSCOPY (EGD) WITH PROPOFOL N/A 10/25/2016   Procedure: ESOPHAGOGASTRODUODENOSCOPY (EGD) WITH PROPOFOL;  Surgeon: Roberto Bellows, MD;  Location: [facility] ENDOSCOPY;  Service: Endoscopy;  Laterality: N/A;  . ESOPHAGOGASTRODUODENOSCOPY (EGD) WITH PROPOFOL N/A 11/02/2016   Procedure: ESOPHAGOGASTRODUODENOSCOPY (EGD) WITH PROPOFOL;  Surgeon: Roberto Lame, MD;  Location: [facility] ENDOSCOPY;  Service: Endoscopy;  Laterality: N/A;  . HAND SURGERY      Family History  Problem Relation Age of Onset  . Heart failure Mother     Social History Social History  Substance Use Topics  . Smoking status: Current Every Day Smoker    Packs/day: 0.25    Years:  40.00    Types: Cigarettes  . Smokeless tobacco: Never Used  . Alcohol use No    Allergies  Allergen Reactions  . Haldol [Haloperidol Lactate] Other (See Comments)    Shaking uncontrollable   . Ampicillin   . Depakote [Divalproex Sodium]   . Lamictal [Lamotrigine]   . Penicillins Other (See Comments)    Reaction unknown-childhood allergy    Current Facility-Administered Medications  Medication Dose Route Frequency Provider Last Rate Last Dose  . acetaminophen (TYLENOL) tablet 650 mg  650 mg Oral Q6H PRN Roberto Leber, MD       Or  . acetaminophen (TYLENOL) suppository 650 mg  650 mg Rectal Q6H PRN Roberto Leber, MD   650 mg at 11/02/16 2317  . albuterol (PROVENTIL) (2.5 MG/3ML) 0.083% nebulizer solution 3 mL  3 mL Inhalation Q6H PRN Roberto Leber, MD      . bisacodyl (DULCOLAX) suppository 10 mg  10 mg Rectal Daily PRN Gladstone Lighter, MD   10 mg at 11/02/16 2236  . chlorhexidine (PERIDEX) 0.12 % solution 15 mL  15 mL Mouth Rinse BID Roberto Leber, MD   15 mL at 11/05/16 1000  . Chlorhexidine Gluconate Cloth 2 % PADS 6 each  6 each Topical Q0600 Roberto Leber, MD   6 each at 11/06/16 0745  . enoxaparin (LOVENOX) injection 30 mg  30 mg Subcutaneous QHS Roberto Leber, MD   30 mg at 11/05/16 2153  . insulin aspart (novoLOG) injection 0-9 Units  0-9 Units Subcutaneous  Q4H Gladstone Lighter, MD   1 Units at 11/04/16 1255  . iopamidol (ISOVUE-300) 61 % injection 30 mL  30 mL Oral Once PRN Roberto Newport, MD      . LORazepam (ATIVAN) injection 1 mg  1 mg Intravenous Q6H PRN Gladstone Lighter, MD   1 mg at 11/05/16 1258  . LORazepam (ATIVAN) injection 2 mg  2 mg Intravenous QHS Roberto Lex, MD   2 mg at 11/05/16 2154  . MEDLINE mouth rinse  15 mL Mouth Rinse q12n4p Roberto Leber, MD   15 mL at 11/05/16 1600  . mupirocin ointment (BACTROBAN) 2 % 1 application  1 application Nasal BID Roberto Leber, MD   1 application at [id number]  . OLANZapine zydis (ZYPREXA)  disintegrating tablet 10 mg  10 mg Oral QHS Roberto Lex, MD   10 mg at 11/05/16 2154  . ondansetron (ZOFRAN) tablet 4 mg  4 mg Oral Q6H PRN Roberto Leber, MD       Or  . ondansetron (ZOFRAN) injection 4 mg  4 mg Intravenous Q6H PRN Roberto Leber, MD      . ondansetron (ZOFRAN) tablet 4 mg  4 mg Oral Q4H PRN Roberto Leber, MD      . phenol (CHLORASEPTIC) mouth spray 1 spray  1 spray Mouth/Throat PRN Gladstone Lighter, MD   1 spray at 11/04/16 0937  . pneumococcal 23 valent vaccine (PNU-IMMUNE) injection 0.5 mL  0.5 mL Intramuscular Prior to discharge Roberto Leber, MD      . sodium chloride flush (NS) 0.9 % injection 10-40 mL  10-40 mL Intracatheter Q12H Roberto Mandes, MD   10 mL at 11/05/16 2154  . sodium chloride flush (NS) 0.9 % injection 10-40 mL  10-40 mL Intracatheter PRN Roberto Mandes, MD      . sodium chloride flush (NS) 0.9 % injection 3 mL  3 mL Intravenous Q12H Roberto Leber, MD   3 mL at 11/05/16 2154  . TPN (CLINIMIX 5/15) Adult with electrolyte additives   Intravenous Continuous TPN Roberto Mandes, MD 40 mL/hr at 11/06/16 0900       Review of Systems A Multi-point review of systems was asked and was negative except for the findings documented in the history of present illness  Physical Exam Blood pressure 103/60, pulse 64, temperature 97.9 F (36.6 C), temperature source Oral, resp. rate 18, height 5\' 10"  (1.778 m), weight 46.6 kg (102 lb 11.2 oz), SpO2 100 %. CONSTITUTIONAL: Cachectic, resting in bed in no acute distress. EYES: Pupils are equal, round, and reactive to light, Sclera are non-icteric. EARS, NOSE, MOUTH AND THROAT: The oropharynx is clear. The oral mucosa is pink and moist. Hearing is intact to voice. LYMPH NODES:  Lymph nodes in the neck are normal. RESPIRATORY:  Lungs are clear. There is normal respiratory effort, with equal breath sounds bilaterally, and without pathologic use of accessory muscles. CARDIOVASCULAR: Heart is regular without murmurs, gallops, or  rubs. GI: The abdomen is soft, nontender, and nondistended. There are no palpable masses. There is no hepatosplenomegaly. There are normal bowel sounds in all quadrants. Abdomen is very thin. GU: Rectal deferred.   MUSCULOSKELETAL: Normal muscle strength and tone. No cyanosis or edema.   SKIN: Turgor is good and there are no pathologic skin lesions or ulcers. NEUROLOGIC: Motor and sensation is grossly normal. Cranial nerves are grossly intact. PSYCH:  Oriented to person, place and time. Patient has flight of ideas and labile emotions.Marland Kitchen  Data Reviewed Images and labs reviewed. His labs are all within normal limits. CT of the abdomen and pelvis shows a stricturing of the distal esophagus that appears to be a locally advanced tumor. Upper endoscopy shows a stricture of the lower esophagus that is unable to be passed. 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 I have personally reviewed the patient's imaging, laboratory findings and medical records.    Assessment    Esophageal cancer    Plan    56 year old male with esophageal cancer causing esophageal stricture. This is a difficult clinical problem that is only worsened by the patient's mental status with his paranoid schizophrenia. Although a feeding tube would be technically possible question still arise at this point about staging of the cancer and other treatments that may or may not be required. Itcontinues to be the recommendation of the surgery department here that the patient be transferred to a tertiary care center if possible. Given the extensive stricturing related to this cancer it is probable that the tumor is not resectable. He will likely require an upper esophageal diversion (such as a spit fistula) to handle his own saliva in the near future which is a procedure that is currently outside our capabilities at this facility. If the tumor is deemed not resectable a gastric feeding tube would be  preferable to a jejunal feeding tube. If there is any possibility of future resection and a jejunal feeding tube would be preferable to a gastric feeding tube. The decision on whether or not this tumor is resectable should be made by a surgical oncologist at a tertiary care center. Recommend continuing TPN at this time. The surgical services will continue to follow with you but there are no immediate plans for operative intervention.     Time spent with the patient was 80 minutes, with more than 50% of the time spent in face-to-face education, counseling and care coordination.     Clayburn Pert, MD FACS General Surgeon 11/06/2016, 11:10 AM

## 2016-11-06 NOTE — Progress Notes (Signed)
[facility] [place] NAME: Roberto Davis    MR#:  [id number]  DATE OF BIRTH:  10/30/1960  SUBJECTIVE:   - Patient admitted with weight loss and dysphagia. EGD done  showing distal esophageal stricture and dilatation of esophagus proximal to that. -Recommended nothing by mouth and parenteral nutrition at this time. -Patient quite pleasant today. TPN has been initiated after PICC line was placed last evening.  REVIEW OF SYSTEMS:  Review of Systems  Constitutional: Negative for chills, fever and weight loss.  HENT: Negative for ear discharge, ear pain and nosebleeds.   Eyes: Negative for blurred vision, pain and discharge.  Respiratory: Negative for sputum production, shortness of breath, wheezing and stridor.   Cardiovascular: Negative for chest pain, palpitations, orthopnea and PND.  Gastrointestinal: Negative for abdominal pain, diarrhea, nausea and vomiting.  Genitourinary: Negative for frequency and urgency.  Musculoskeletal: Negative for back pain and joint pain.  Neurological: Positive for weakness. Negative for sensory change, speech change and focal weakness.  Psychiatric/Behavioral: Negative for depression and hallucinations. The patient is not nervous/anxious.     DRUG ALLERGIES:   Allergies  Allergen Reactions  . Haldol [Haloperidol Lactate] Other (See Comments)    Shaking uncontrollable   . Ampicillin   . Depakote [Divalproex Sodium]   . Lamictal [Lamotrigine]   . Penicillins Other (See Comments)    Reaction unknown-childhood allergy    VITALS:  Blood pressure 100/64, pulse (!) 47, temperature 97.7 F (36.5 C), temperature source Oral, resp. rate 18, height 5\' 10"  (1.778 m), weight 46.6 kg (102 lb 11.2 oz), SpO2 100 %.  PHYSICAL EXAMINATION:  Physical Exam  GENERAL:  56 y.o.-year-old severely malnourished patient lying in the bed with no acute distress.  EYES: Pupils equal, round, reactive to light and  accommodation. No scleral icterus. Extraocular muscles intact.  HEENT: Head atraumatic, normocephalic. Oropharynx and nasopharynx clear.  NECK:  Supple, no jugular venous distention. No thyroid enlargement, no tenderness.  LUNGS: Normal breath sounds bilaterally, no wheezing, rales,rhonchi or crepitation. No use of accessory muscles of respiration.  CARDIOVASCULAR: S1, S2 normal. No murmurs, rubs, or gallops.  ABDOMEN: Soft, nontender, nondistended. Bowel sounds present. No organomegaly or mass.  EXTREMITIES: No pedal edema, cyanosis, or clubbing. Right upper extremity PICC line NEUROLOGIC: Cranial nerves II through XII are intact. Able to Move extremities in bed. Sensation intact, not following commands because he is very irritable PSYCHIATRIC:  patient is alert and oriented. Very angry  SKIN: No obvious rash, lesion, or ulcer.    LABORATORY PANEL:   CBC  Recent Labs Lab 11/02/16 0415  WBC 9.9  HGB 13.0  HCT 37.6*  PLT 279   ------------------------------------------------------------------------------------------------------------------  Chemistries   Recent Labs Lab 11/03/16 0601  11/06/16 0417  NA 136  < > 135  K 3.9  < > 4.0  CL 107  < > 106  CO2 24  < > 24  GLUCOSE 86  < > 94  BUN 25*  < > 18  CREATININE 0.61  < > 0.46*  CALCIUM 8.6*  < > 8.3*  MG 1.7  < > 1.7  AST 16  --   --   ALT 13*  --   --   ALKPHOS 53  --   --   BILITOT 0.9  --   --   < > = values in this interval not displayed. ------------------------------------------------------------------------------------------------------------------  Cardiac Enzymes  Recent Labs Lab 11/01/16 1244  TROPONINI 0.04*   ------------------------------------------------------------------------------------------------------------------  RADIOLOGY:  No results found.  EKG:   Orders placed or performed during the hospital encounter of 11/01/16  . ED EKG  . ED EKG    ASSESSMENT AND PLAN:   56 year old  male with past medical history paranoid schizophrenia, COPD, hyperlipidemia, who presents to the hospital due to ongoing weight loss, dysphagia and noted to have an upper GI series concerning for a high-grade esophageal stricture concerning for neoplasm.  1. Dysphagia-patient had an upper GI series which shows a high-grade esophageal stricture with bx shwoing Adenocarcinoma of the esophageal  -Weight loss, dysphagia and nausea vomiting going on severely for the last 3 months at least. -EGD done showing severe achalasia and a distal esophageal complete stricture/obstruction. Biopsies done. -Appreciate GI consult. Unable to pass the probe through endoscopy currently. --Consulted interventional radiologist Dr. Kathlene Cote on call, because his stomach is so shrunken, and has hiatal hernia, unable to place a G-tube at this time. -Dietitian consult for TPN started on 11/04/16 -Discussed with surgery who recommends patient be transferred to tertiary care center given complexity of the case and unable to handle here. This was discussed with Dr. Azalee Course -Not successful for transferring pt to tertiary care center due to different reasons. -Dr Gary Fleet input appreciated  -A long discussion with patient's healthcare power of attorney [name]  Regarding placement og Feeding J tube, complications and further rx of cancer --Right upper extremity PICC line placed on 11/03/2016. TPN started.  2. Acute kidney injury-secondary to dehydration. -on IV TPN  3. Hypokalemia-secondary to poor by mouth intake and dehydration. -on supplements   4. Adult Failure to thrive due to Esophageal cancer -Surgery input appreciated. Placement of Chemo port and J tube on Thursday or Friday  5. Hyperlipidemia - cont. Crestor.   6. COPD - no acute exacerbation.  - cont. Albuterol inhaler PRN  7. Paranoid Schizophrenia - patient is very angry, likely was not absorbing his meds for a while now Psych consult  appreciated. Per Dr clapacs cont current meds zyprexa and ativan  8. DVT prophylaxis-on Lovenox  Transfer to tertiary care center attempted but remains challenging!!   All the records are reviewed and case discussed with Care Management/Social Workerr. Management plans discussed with the patient, family and they are in agreement.  CODE STATUS: Full code  TOTAL TIME TAKING CARE OF THIS PATIENT: 35 minutes.   POSSIBLE D/C ? DAYS, DEPENDING ON CLINICAL CONDITION.   Callee Rohrig M.D on 11/06/2016 at 7:33 PM  Between 7am to 6pm - Pager - [phone]  After 6pm go to [email or web address] - password [email or web address] [facility]  Office  [phone]  CC: Primary care physician; No PCP Per Patient

## 2016-11-07 ENCOUNTER — Ambulatory Visit (HOSPITAL_COMMUNITY)
Admission: AD | Admit: 2016-11-07 | Discharge: 2016-11-07 | Disposition: A | Discharge status: Other Acute Inpatient Hospital | Payer: Medicare Other | Source: Other Acute Inpatient Hospital | Attending: Internal Medicine | Admitting: Internal Medicine

## 2016-11-07 DIAGNOSIS — F209 Schizophrenia, unspecified: Secondary | ICD-10-CM | POA: Insufficient documentation

## 2016-11-07 DIAGNOSIS — E43 Unspecified severe protein-calorie malnutrition: Secondary | ICD-10-CM | POA: Insufficient documentation

## 2016-11-07 DIAGNOSIS — J449 Chronic obstructive pulmonary disease, unspecified: Secondary | ICD-10-CM | POA: Insufficient documentation

## 2016-11-07 DIAGNOSIS — C159 Malignant neoplasm of esophagus, unspecified: Secondary | ICD-10-CM | POA: Insufficient documentation

## 2016-11-07 LAB — BASIC METABOLIC PANEL
Anion gap: 5 (ref 5–15)
BUN: 19 mg/dL (ref 6–20)
CALCIUM: 8.7 mg/dL — AB (ref 8.9–10.3)
CHLORIDE: 102 mmol/L (ref 101–111)
CO2: 27 mmol/L (ref 22–32)
CREATININE: 0.56 mg/dL — AB (ref 0.61–1.24)
Glucose, Bld: 78 mg/dL (ref 65–99)
Potassium: 3.8 mmol/L (ref 3.5–5.1)
SODIUM: 134 mmol/L — AB (ref 135–145)

## 2016-11-07 LAB — PHOSPHORUS: PHOSPHORUS: 3.1 mg/dL (ref 2.5–4.6)

## 2016-11-07 LAB — GLUCOSE, CAPILLARY
Glucose-Capillary: 102 mg/dL — ABNORMAL HIGH (ref 65–99)
Glucose-Capillary: 120 mg/dL — ABNORMAL HIGH (ref 65–99)
Glucose-Capillary: 98 mg/dL (ref 65–99)

## 2016-11-07 LAB — MAGNESIUM: MAGNESIUM: 1.8 mg/dL (ref 1.7–2.4)

## 2016-11-07 MED ORDER — FAT EMULSION 20 % IV EMUL: 240.0000 mL | INTRAVENOUS | 0 refills | Status: AC

## 2016-11-07 NOTE — Progress Notes (Signed)
Notified by RN that she received a call from [facility] and pt has a bed Spoke with Fraser Din flemming that pt will transfer and he is gareeable  Pt is also agreeable

## 2016-11-07 NOTE — Care Management (Signed)
Received telephone call from [facility]. Dr. Theodoro Doing will accept Mr. Atnip into their facility today. Transportation will be arranged per [employer]. Attorney Erling Cruz would like to be notified when transported.                                                   Shelbie Ammons RN MSN CCM Care Management

## 2016-11-07 NOTE — Progress Notes (Signed)
SLP Cancellation Note  Patient Details Name: Roberto Davis MRN: [id number] DOB: March 25, 1961   Cancelled treatment:       Reason Eval/Treat Not Completed: Medical issues which prohibited therapy (reviewed chart notes; consulted MD). Pt has Esophageal dysphagia w/ severe stricture causing severe dysmotility w/ regurgitation. MD stated pt was receiving a J-tube tomorrow w/ TPN today w/ possible ice chips for pleasure. MD indicated no need for a BSE at this time. ST services will sign off w/ MD to reconsult if indicated.   Orinda Kenner, MS, CCC-SLP Watson,Katherine 11/07/2016, 8:51 AM

## 2016-11-07 NOTE — Plan of Care (Signed)
Problem: Education: Goal: Knowledge of [facility] Education information/materials will improve Outcome: Progressing VSS, free of falls during shift.  Denies pain.  Repeatedly requested PO intake, stated esophagus not completely obstructed.  Dr. Ara Kussmaul paged, scans reviewed, pt remains NPO.  Pt expressed frustration, RN provided support.  No other needs expressed.  Bed in low position, call bell within reach.  WCTM.

## 2016-11-07 NOTE — Plan of Care (Signed)
Patient being transferred to [facility] for tertiary care - surgery for esophageal stricture r/t new adenocarcinoma.  Called report to [place].  Patient was allowed to have ice chips and [place] ices and he tolerated both well.  TPN continues to run and he will transport with that.

## 2016-11-07 NOTE — Discharge Summary (Signed)
[place] [facility] [place] NAME: Roberto Davis    MR#:  [id number]  DATE OF BIRTH:  [birth year]  DATE OF ADMISSION:  11/01/2016 ADMITTING PHYSICIAN: Henreitta Leber, MD  DATE OF DISCHARGE: 11/07/16  PRIMARY CARE PHYSICIAN: No PCP Per Patient    ADMISSION DIAGNOSIS:  Cachexia ([place]) [R64] Dehydration [E86.0] Esophageal obstruction [K22.2] Unintentional weight loss [R63.4] AKI (acute kidney injury) ([place]) [N17.9]  DISCHARGE DIAGNOSIS:  Esophageal adenocarcinoma (new diagnosis) Severe weight loss, cachexia Severe protein calorie malnutrition---now on IV TPN H/o Schizophrenia  SECONDARY DIAGNOSIS:   Past Medical History:  Diagnosis Date  . COPD (chronic obstructive pulmonary disease) ([place])   . High cholesterol   . Paranoid schizophrenia [facility])     HOSPITAL COURSE:  56 year old male with past medical history paranoid schizophrenia, COPD, hyperlipidemia, who presents to the hospital due to ongoing weight loss, dysphagia and noted to have an upper GI series concerning for a high-grade esophageal stricture concerning for neoplasm.  1. Dysphagia-patient had an upper GI series which shows a high-grade esophageal stricture with bx shwoing Adenocarcinoma of the esophagus -Weight loss, dysphagia and nausea vomiting going on severely for the last 3 months  -EGD done showing severe achalasia and a distal esophageal complete stricture/obstruction. . -Appreciate GI consult. Unable to pass the probe through endoscopy currently. --Consulted interventional radiologist Dr. Kathlene Cote ,because his stomach is so shrunken, and has hiatal hernia, unable to place a G-tube at this time. -Dietitian consult for TPN started on 11/04/16 -Discussed with surgery who recommends patient be transferred to tertiary care center given complexity of the case and unable to handle here. This was discussed with Drs. Loflin and Woodham -Dr Gary Fleet input appreciated  -A  long discussion with patient's healthcare power of attorney [name]  Regarding placement of Feeding J tube, complications and further rx of cancer --Right upper extremity PICC line placed on 11/03/2016. TPN started. -pt on minimal amount of oral pills  2. Acute kidney injury-secondary to dehydration. -on IV TPN  3. Hypokalemia-secondary to poor by mouth intake and dehydration. -on supplements   4. Adult Failure to thrive due to Esophageal cancer -Surgery input appreciated. Placement of Chemo port and J tube on Thursday or Friday  6. COPD - no acute exacerbation.  - cont. Albuterol inhaler PRN  7. Paranoid Schizophrenia - patient is very angry, likely was not absorbing his meds for a while now Psych consult appreciated. Per Dr clapacs cont current meds zyprexa and ativan  8. DVT prophylaxis-on Lovenox  Pt has been accepted at [facility]. Spoke with pt and will transfer today    CONSULTS OBTAINED:  Treatment Team:  Gonzella Lex, MD Grace Isaac, MD Lloyd Huger, MD Clayburn Pert, MD  DRUG ALLERGIES:   Allergies  Allergen Reactions  . Haldol [Haloperidol Lactate] Other (See Comments)    Shaking uncontrollable   . Ampicillin   . Depakote [Divalproex Sodium]   . Lamictal [Lamotrigine]   . Penicillins Other (See Comments)    Reaction unknown-childhood allergy    DISCHARGE MEDICATIONS:   Current Discharge Medication List    START taking these medications   Details  fat emulsion 20 % infusion Inject 240 mLs into the vein continuous. Qty: 250 mL, Refills: 0      CONTINUE these medications which have NOT CHANGED   Details  benztropine (COGENTIN) 1 MG tablet Take 1 mg by mouth daily.     Cholecalciferol (VITAMIN D3) 5000 units CAPS Take 5,000  Units by mouth daily.    LORazepam (ATIVAN) 2 MG tablet Take 2 mg by mouth at bedtime. And prn    OLANZapine zydis (ZYPREXA) 10 MG disintegrating tablet Take 10 mg by mouth at bedtime.    ondansetron  (ZOFRAN) 4 MG tablet Take 4 mg by mouth as needed for nausea or vomiting. q 4-6 h    pantoprazole (PROTONIX) 40 MG tablet Take 40 mg by mouth 2 (two) times daily.     rosuvastatin (CRESTOR) 20 MG tablet Take 20 mg by mouth at bedtime.    sucralfate (CARAFATE) 1 g tablet Take 1 g by mouth 4 (four) times daily.    traMADol (ULTRAM) 50 MG tablet Take 1 tablet (50 mg total) by mouth every 6 (six) hours as needed. Qty: 15 tablet, Refills: 0    triamcinolone cream (KENALOG) 0.1 % Apply 1 application topically 3 (three) times daily as needed (for healing).    TRILIPIX 135 MG capsule Take 135 mg by mouth daily.     VENTOLIN HFA 108 (90 BASE) MCG/ACT inhaler Inhale 1 puff into the lungs every 6 (six) hours as needed.       STOP taking these medications     aspirin EC 81 MG tablet      ibuprofen (ADVIL,MOTRIN) 400 MG tablet      ZETIA 10 MG tablet         If you experience worsening of your admission symptoms, develop shortness of breath, life threatening emergency, suicidal or homicidal thoughts you must seek medical attention immediately by calling 911 or calling your MD immediately  if symptoms less severe.  You Must read complete instructions/literature along with all the possible adverse reactions/side effects for all the Medicines you take and that have been prescribed to you. Take any new Medicines after you have completely understood and accept all the possible adverse reactions/side effects.   Please note  You were cared for by a hospitalist during your hospital stay. If you have any questions about your discharge medications or the care you received while you were in the hospital after you are discharged, you can call the unit and asked to speak with the hospitalist on call if the hospitalist that took care of you is not available. Once you are discharged, your primary care physician will handle any further medical issues. Please note that NO REFILLS for any discharge medications  will be authorized once you are discharged, as it is imperative that you return to your primary care physician (or establish a relationship with a primary care physician if you do not have one) for your aftercare needs so that they can reassess your need for medications and monitor your lab values. Today   SUBJECTIVE    Doing ok. Craving for food. Wants some popsicle VITAL SIGNS:  Blood pressure 105/66, pulse (!) 59, temperature 97.8 F (36.6 C), temperature source Oral, resp. rate 18, height 5\' 10"  (1.778 m), weight 46.1 kg (101 lb 11.2 oz), SpO2 100 %.  I/O:   Intake/Output Summary (Last 24 hours) at 11/07/16 0953 Last data filed at 11/07/16 0556  Gross per 24 hour  Intake             1672 ml  Output              300 ml  Net             1372 ml    PHYSICAL EXAMINATION:  GENERAL:  56 y.o.-year-old severely malnourished patient lying in  the bed with no acute distress.  EYES: Pupils equal, round, reactive to light and accommodation. No scleral icterus. Extraocular muscles intact.  HEENT: Head atraumatic, normocephalic. Oropharynx and nasopharynx clear.  NECK:  Supple, no jugular venous distention. No thyroid enlargement, no tenderness.  LUNGS: Normal breath sounds bilaterally, no wheezing, rales,rhonchi or crepitation. No use of accessory muscles of respiration.  CARDIOVASCULAR: S1, S2 normal. No murmurs, rubs, or gallops.  ABDOMEN: Soft, nontender, nondistended. Bowel sounds present. No organomegaly or mass.  EXTREMITIES: No pedal edema, cyanosis, or clubbing. Right upper extremity PICC line NEUROLOGIC: Cranial nerves II through XII are intact. Able to Move extremities in bed. very pleasant PSYCHIATRIC:  patient is alert and oriented.pleasant SKIN: No obvious rash, lesion, or ulcer.   DATA REVIEW:   CBC   Recent Labs Lab 11/02/16 0415  WBC 9.9  HGB 13.0  HCT 37.6*  PLT 279    Chemistries   Recent Labs Lab 11/03/16 0601  11/07/16 0538  NA 136  < > 134*  K 3.9  < >  3.8  CL 107  < > 102  CO2 24  < > 27  GLUCOSE 86  < > 78  BUN 25*  < > 19  CREATININE 0.61  < > 0.56*  CALCIUM 8.6*  < > 8.7*  MG 1.7  < > 1.8  AST 16  --   --   ALT 13*  --   --   ALKPHOS 53  --   --   BILITOT 0.9  --   --   < > = values in this interval not displayed.  Microbiology Results   Recent Results (from the past 240 hour(s))  MRSA PCR Screening     Status: Abnormal   Collection Time: 11/02/16  1:32 AM  Result Value Ref Range Status   MRSA by PCR POSITIVE (A) NEGATIVE Final    Comment:        The GeneXpert MRSA Assay (FDA approved for NASAL specimens only), is one component of a comprehensive MRSA colonization surveillance program. It is not intended to diagnose MRSA infection nor to guide or monitor treatment for MRSA infections. RESULT CALLED TO, READ BACK BY AND VERIFIED WITH: CRYSTAL BALENTINE 11/02/16 @ 0303  [place]:  No results found.   Management plans discussed with the patient, family and they are in agreement.  CODE STATUS:     Code Status Orders        Start     Ordered   11/02/16 0044  Full code  Continuous     11/02/16 0043    Code Status History    Date Active Date Inactive Code Status Order ID Comments User Context   This patient has a current code status but no historical code status.    Advance Directive Documentation   Flowsheet Row Most Recent Value  Type of Advance Directive  Healthcare Power of Attorney  Pre-existing out of facility DNR order (yellow form or pink MOST form)  No data  "MOST" Form in Place?  No data      TOTAL TIME TAKING CARE OF THIS PATIENT: 40  minutes.    Zoya Sprecher M.D on 11/07/2016 at 9:53 AM  Between 7am to 6pm - Pager - [phone] After 6pm go to [email or web address] - password [email or web address] [facility]  [place] [facility]  Office  [phone]  CC: Primary care physician; No PCP Per Patient

## 2016-11-21 ENCOUNTER — Encounter: Payer: Self-pay | Admitting: Radiation Oncology

## 2016-11-21 ENCOUNTER — Ambulatory Visit
Admission: RE | Admit: 2016-11-21 | Discharge: 2016-11-21 | Disposition: A | Discharge status: Home / Self Care | Payer: Medicare Other | Source: Ambulatory Visit | Attending: Radiation Oncology | Admitting: Radiation Oncology

## 2016-11-21 VITALS — BP 102/75 | HR 74 | Temp 97.3°F | Resp 20 | Wt 101.3 lb

## 2016-11-21 DIAGNOSIS — C155 Malignant neoplasm of lower third of esophagus: Secondary | ICD-10-CM | POA: Insufficient documentation

## 2016-11-21 DIAGNOSIS — J449 Chronic obstructive pulmonary disease, unspecified: Secondary | ICD-10-CM | POA: Insufficient documentation

## 2016-11-21 DIAGNOSIS — C7951 Secondary malignant neoplasm of bone: Secondary | ICD-10-CM | POA: Insufficient documentation

## 2016-11-21 DIAGNOSIS — Z79899 Other long term (current) drug therapy: Secondary | ICD-10-CM | POA: Insufficient documentation

## 2016-11-21 DIAGNOSIS — C771 Secondary and unspecified malignant neoplasm of intrathoracic lymph nodes: Secondary | ICD-10-CM | POA: Insufficient documentation

## 2016-11-21 DIAGNOSIS — F209 Schizophrenia, unspecified: Secondary | ICD-10-CM | POA: Insufficient documentation

## 2016-11-21 DIAGNOSIS — F17210 Nicotine dependence, cigarettes, uncomplicated: Secondary | ICD-10-CM | POA: Insufficient documentation

## 2016-11-21 DIAGNOSIS — C159 Malignant neoplasm of esophagus, unspecified: Secondary | ICD-10-CM

## 2016-11-21 DIAGNOSIS — E46 Unspecified protein-calorie malnutrition: Secondary | ICD-10-CM | POA: Insufficient documentation

## 2016-11-21 DIAGNOSIS — Z510 Encounter for antineoplastic radiation therapy: Secondary | ICD-10-CM | POA: Insufficient documentation

## 2016-11-21 DIAGNOSIS — R634 Abnormal weight loss: Secondary | ICD-10-CM | POA: Insufficient documentation

## 2016-11-21 DIAGNOSIS — E7800 Pure hypercholesterolemia, unspecified: Secondary | ICD-10-CM | POA: Insufficient documentation

## 2016-11-21 NOTE — Consult Note (Signed)
NEW PATIENT EVALUATION  Name: Roberto Davis  MRN: [id number]  Date:   11/21/2016     DOB: 1961/04/26   This 56 y.o. male patient presents to the clinic for initial evaluation of stage IV distal esophageal adenocarcinoma in patient with schizophrenia.  REFERRING PHYSICIAN: Isac Sarna, MD  CHIEF COMPLAINT:  Chief Complaint  Patient presents with  . Esophageal Cancer    Pt is here for initial consultation of esophageal cancer.      DIAGNOSIS: The encounter diagnosis was Esophageal adenocarcinoma ([place]).   PREVIOUS INVESTIGATIONS:  CT scan and PET CT scan reviewed Clinical notes reviewed Pathology report reviewed  HPI: Patient is a 56 year old male with severe schizophrenia who has presented with significant weight loss and malnutrition and found on workup to have a completely obstructive esophageal mass in the distal esophagus. 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 PET CT was performed showing esophageal cancer with paraesophageal nodal and numerous bony metastasis. He's been seen by medical oncology. He's been of being recently receiving his nutrition from sucking candies and soft food as well as TPN. Patient has adamantly refused PEG tube placement. He is being seen today for consideration of palliative radiation therapy.  PLANNED TREATMENT REGIMEN: Concurrent chemoradiation with palliative benefit  PAST MEDICAL HISTORY:  has a past medical history of COPD (chronic obstructive pulmonary disease) ([place]); High cholesterol; and Paranoid schizophrenia ([place]).    PAST SURGICAL HISTORY:  Past Surgical History:  Procedure Laterality Date  . DG BARIUM SWALLOW ([place] HX)    . ESOPHAGOGASTRODUODENOSCOPY (EGD) WITH PROPOFOL N/A 10/25/2016   Procedure: ESOPHAGOGASTRODUODENOSCOPY (EGD) WITH PROPOFOL;  Surgeon: Jonathon Bellows, MD;  Location: [facility] ENDOSCOPY;  Service: Endoscopy;  Laterality: N/A;  .  ESOPHAGOGASTRODUODENOSCOPY (EGD) WITH PROPOFOL N/A 11/02/2016   Procedure: ESOPHAGOGASTRODUODENOSCOPY (EGD) WITH PROPOFOL;  Surgeon: Lucilla Lame, MD;  Location: [facility] ENDOSCOPY;  Service: Endoscopy;  Laterality: N/A;  . HAND SURGERY      FAMILY HISTORY: family history includes Heart failure in his mother.  SOCIAL HISTORY:  reports that he has been smoking Cigarettes.  He has a 10.00 pack-year smoking history. He has never used smokeless tobacco. He reports that he uses drugs, including Marijuana and "Crack" cocaine. He reports that he does not drink alcohol.  ALLERGIES: Haldol [haloperidol lactate]; Ampicillin; Depakote [divalproex sodium]; Diphenhydramine hcl; Lamictal [lamotrigine]; and Penicillins  MEDICATIONS:  Current Outpatient Prescriptions  Medication Sig Dispense Refill  . benztropine (COGENTIN) 1 MG tablet Take 1 mg by mouth daily.     Marland Kitchen OLANZapine zydis (ZYPREXA) 10 MG disintegrating tablet Take 10 mg by mouth at bedtime.    . ondansetron (ZOFRAN) 4 MG tablet Take 4 mg by mouth as needed for nausea or vomiting. q 4-6 h    . pantoprazole (PROTONIX) 40 MG tablet Take 40 mg by mouth 2 (two) times daily.     . sucralfate (CARAFATE) 1 g tablet Take 1 g by mouth 4 (four) times daily.    . traMADol (ULTRAM) 50 MG tablet Take 1 tablet (50 mg total) by mouth every 6 (six) hours as needed. 15 tablet 0  . VENTOLIN HFA 108 (90 BASE) MCG/ACT inhaler Inhale 1 puff into the lungs every 6 (six) hours as needed.     . Cholecalciferol (VITAMIN D3) 5000 units CAPS Take 5,000 Units by mouth daily.    . fat emulsion 20 % infusion Inject 240 mLs into the vein continuous. (Patient not taking: Reported on 11/21/2016)  250 mL 0  . LORazepam (ATIVAN) 2 MG tablet Take 2 mg by mouth at bedtime. And prn    . triamcinolone cream (KENALOG) 0.1 % Apply 1 application topically 3 (three) times daily as needed (for healing).     No current facility-administered medications for this encounter.     ECOG PERFORMANCE  STATUS:  1 - Symptomatic but completely ambulatory  REVIEW OF SYSTEMS:  Patient denies any weight loss, fatigue, weakness, fever, chills or night sweats. Patient denies any loss of vision, blurred vision. Patient denies any ringing  of the ears or hearing loss. No irregular heartbeat. Patient denies heart murmur or history of fainting. Patient denies any chest pain or pain radiating to her upper extremities. Patient denies any shortness of breath, difficulty breathing at night, cough or hemoptysis. Patient denies any swelling in the lower legs. Patient denies any nausea vomiting, vomiting of blood, or coffee ground material in the vomitus. Patient denies any stomach pain. Patient states has had normal bowel movements no significant constipation or diarrhea. Patient denies any dysuria, hematuria or significant nocturia. Patient denies any problems walking, swelling in the joints or loss of balance. Patient denies any skin changes, loss of hair or loss of weight. Patient denies any excessive worrying or anxiety or significant depression. Patient denies any problems with insomnia. Patient denies excessive thirst, polyuria, polydipsia. Patient denies any swollen glands, patient denies easy bruising or easy bleeding. Patient denies any recent infections, allergies or URI. Patient "s visual fields have not changed significantly in recent time.    PHYSICAL EXAM: BP 102/75   Pulse 74   Temp 97.3 F (36.3 C)   Resp 20   Wt 101 lb 4.8 oz (45.9 kg)   BMI 14.54 kg/m  Thin cachectic male in NAD with obvious schizophrenia. No evidence of cervical or supra clavicular adenopathy is identified. Well-developed well-nourished patient in NAD. HEENT reveals PERLA, EOMI, discs not visualized.  Oral cavity is clear. No oral mucosal lesions are identified. Neck is clear without evidence of cervical or supraclavicular adenopathy. Lungs are clear to A&P. Cardiac examination is essentially unremarkable with regular rate and  rhythm without murmur rub or thrill. Abdomen is benign with no organomegaly or masses noted. Motor sensory and DTR levels are equal and symmetric in the upper and lower extremities. Cranial nerves II through XII are grossly intact. Proprioception is intact. No peripheral adenopathy or edema is identified. No motor or sensory levels are noted. Crude visual fields are within normal range.  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  PLAN: All of the patient has difficulty laying still because of back pain will attempt a simulation. We treated a hypofractionated course of treatment 3000 cGy in 10 fractions and evaluate for response. Would treat him with concurrent chemotherapy under medical oncology's direction.There will be extra effort by both professional staff as well as technical staff to coordinate and manage concurrent chemoradiation and ensuing side effects during his treatments. Risks and benefits of treatment including skin reaction fatigue alteration of blood counts possible worsening of dysphasia all were discussed with the patient. I've also counseled him that PEG tube placement would help him with his nutritional status although he is still adamant about having any type of tube placed in his abdomen. I person set up and ordered CT situation for next week. Also  set up a follow-up appointment with medical oncology to coordinate his chemotherapy.  I would like to take this opportunity to thank you for allowing me to participate in the care of your patient.Armstead Peaks., MD

## 2016-11-26 DIAGNOSIS — C159 Malignant neoplasm of esophagus, unspecified: Secondary | ICD-10-CM | POA: Insufficient documentation

## 2016-11-26 NOTE — Progress Notes (Signed)
[place]  [phone] Fax:(336) [phone]  ID: Roberto Davis OB: May 05, 1961  MR#: [id number]  [id number]  Patient Care Team: No Pcp Per Patient as PCP - General (General Practice) Lucilla Lame, MD as Consulting Physician (Gastroenterology)  CHIEF COMPLAINT: Stage IVB esophageal cancer, lower third.  INTERVAL HISTORY: Patient is a 55 year old male with severe schizophrenia who initially evaluated in the hospital on November 06, 2016. He was admitted with significant weight loss and malnutrition and found to have a nearly fully obstructing esophageal stricture confirmed to be malignancy. He was subsequently transferred to [facility] for consideration of PEG tubeand ultimately refused. On PET scan patient was found to have widely metastatic disease and he is referred back for palliative XRT and consideration of concurrent chemotherapy. Patient's history is difficult given his underlying schizophrenia. He has no neurologic complaints. He denies any recent fevers. He denies any chest pain or shortness of breath. He has significant weight loss and is unable to tolerate PO intake. He denies any constipation or diarrhea. He does not complain of pain currently. He has no urinary complaints. Patient offers no further specific complaints.  REVIEW OF SYSTEMS:   Review of Systems  Constitutional: Positive for malaise/fatigue and weight loss. Negative for fever.  Respiratory: Negative.  Negative for cough and shortness of breath.   Cardiovascular: Negative.  Negative for chest pain and leg swelling.  Gastrointestinal: Positive for nausea and vomiting. Negative for abdominal pain.  Genitourinary: Negative.   Musculoskeletal: Negative.   Neurological: Positive for weakness.  Psychiatric/Behavioral: Negative.     As per HPI. Otherwise, a complete review of systems is negative.  PAST MEDICAL HISTORY: Past Medical History:  Diagnosis Date  . COPD (chronic obstructive  pulmonary disease) ([place])   . High cholesterol   . Paranoid schizophrenia ([place])     PAST SURGICAL HISTORY: Past Surgical History:  Procedure Laterality Date  . DG BARIUM SWALLOW ([place] HX)    . ESOPHAGOGASTRODUODENOSCOPY (EGD) WITH PROPOFOL N/A 10/25/2016   Procedure: ESOPHAGOGASTRODUODENOSCOPY (EGD) WITH PROPOFOL;  Surgeon: Jonathon Bellows, MD;  Location: [facility] ENDOSCOPY;  Service: Endoscopy;  Laterality: N/A;  . ESOPHAGOGASTRODUODENOSCOPY (EGD) WITH PROPOFOL N/A 11/02/2016   Procedure: ESOPHAGOGASTRODUODENOSCOPY (EGD) WITH PROPOFOL;  Surgeon: Lucilla Lame, MD;  Location: [facility] ENDOSCOPY;  Service: Endoscopy;  Laterality: N/A;  . HAND SURGERY      FAMILY HISTORY: Family History  Problem Relation Age of Onset  . Heart failure Mother     ADVANCED DIRECTIVES (Y/N):  N  HEALTH MAINTENANCE: Social History  Substance Use Topics  . Smoking status: Current Every Day Smoker    Packs/day: 0.25    Years: 40.00    Types: Cigarettes  . Smokeless tobacco: Never Used  . Alcohol use No     Colonoscopy:  PAP:  Bone density:  Lipid panel:  Allergies  Allergen Reactions  . Haldol [Haloperidol Lactate] Other (See Comments)    Other reaction(s): Other (See Comments) Shaking uncontrollable  Shaking uncontrollable   . Ampicillin   . Depakote [Divalproex Sodium]   . Diphenhydramine Hcl   . Lamictal [Lamotrigine]   . Penicillins Other (See Comments)    Reaction unknown-childhood allergy    Current Outpatient Prescriptions  Medication Sig Dispense Refill  . benztropine (COGENTIN) 1 MG tablet Take 1 mg by mouth daily.     . Cholecalciferol (VITAMIN D3) 5000 units CAPS Take 5,000 Units by mouth daily.    Marland Kitchen LORazepam (ATIVAN) 2 MG tablet Take 2 mg by mouth at bedtime.  And prn    . OLANZapine zydis (ZYPREXA) 10 MG disintegrating tablet Take 10 mg by mouth at bedtime.    . ondansetron (ZOFRAN) 4 MG tablet Take 4 mg by mouth as needed for nausea or vomiting. q 4-6 h    . pantoprazole (PROTONIX) 40  MG tablet Take 40 mg by mouth 2 (two) times daily.     . sucralfate (CARAFATE) 1 g tablet Take 1 g by mouth 4 (four) times daily.    . traMADol (ULTRAM) 50 MG tablet Take 1 tablet (50 mg total) by mouth every 6 (six) hours as needed. 15 tablet 0  . triamcinolone cream (KENALOG) 0.1 % Apply 1 application topically 3 (three) times daily as needed (for healing).    . VENTOLIN HFA 108 (90 BASE) MCG/ACT inhaler Inhale 1 puff into the lungs every 6 (six) hours as needed.     . fat emulsion 20 % infusion Inject 240 mLs into the vein continuous. (Patient not taking: Reported on 11/21/2016) 250 mL 0   No current facility-administered medications for this visit.     OBJECTIVE: Vitals:   11/27/16 1509  BP: 105/71  Pulse: 67  Resp: 18  Temp: 97.5 F (36.4 C)     Body mass index is 14.52 kg/m.    ECOG FS:1 - Symptomatic but completely ambulatory  General: Well-developed, well-nourished, no acute distress. Eyes: Pink conjunctiva, anicteric sclera. HEENT: Normocephalic, moist mucous membranes, clear oropharnyx. Lungs: Clear to auscultation bilaterally. Heart: Regular rate and rhythm. No rubs, murmurs, or gallops. Abdomen: Soft, nontender, nondistended. No organomegaly noted, normoactive bowel sounds. Musculoskeletal: No edema, cyanosis, or clubbing. Neuro: Alert, answering all questions appropriately. Cranial nerves grossly intact. Skin: No rashes or petechiae noted. Psych: Normal affect. Lymphatics: No cervical, calvicular, axillary or inguinal LAD.   LAB RESULTS:  Lab Results  Component Value Date   NA 134 (L) 11/07/2016   K 3.8 11/07/2016   CL 102 11/07/2016   CO2 27 11/07/2016   GLUCOSE 78 11/07/2016   BUN 19 11/07/2016   CREATININE 0.56 (L) 11/07/2016   CALCIUM 8.7 (L) 11/07/2016   PROT 5.4 (L) 11/03/2016   ALBUMIN 3.0 (L) 11/03/2016   AST 16 11/03/2016   ALT 13 (L) 11/03/2016   ALKPHOS 53 11/03/2016   BILITOT 0.9 11/03/2016   GFRNONAA >60 11/07/2016   GFRAA >60 11/07/2016     Lab Results  Component Value Date   WBC 9.9 11/02/2016   NEUTROABS 8.5 (H) 11/01/2016   HGB 13.0 11/02/2016   HCT 37.6 (L) 11/02/2016   MCV 88.8 11/02/2016   PLT 279 11/02/2016     STUDIES: Ct Chest W Contrast  Result Date: 11/01/2016 CLINICAL DATA:  Distal esophageal stricture on esophagram. Weight loss EXAM: CT CHEST, ABDOMEN, AND PELVIS WITH CONTRAST TECHNIQUE: Multidetector CT imaging of the chest, abdomen and pelvis was performed following the standard protocol during bolus administration of intravenous contrast. CONTRAST:  3mL ISOVUE-300 IOPAMIDOL (ISOVUE-300) INJECTION 61%, 1 ISOVUE-300 IOPAMIDOL (ISOVUE-300) INJECTION 61% COMPARISON:  Esophagram 11/01/2016 FINDINGS: CT CHEST FINDINGS Cardiovascular: No significant vascular findings. Normal heart size. No pericardial effusion. Mediastinum/Nodes: No axillary or supraclavicular adenopathy. The distal esophagus is patulous and distended to 5.7 cm. There is some retained barium from esophagram 1 day prior. There is circumferential narrowing of esophagus over a 3 cm segment leading up to the GE junction. There is a hiatal hernia. Lungs/Pleura: There is mild ground-glass airspace opacities in LEFT lower lobe. There branching pattern. There is a material within the LEFT  lower lobe bronchi. Findings suggest aspiration pneumonitis. Similar findings in the Right upper lobe laterally. Musculoskeletal: Very little body fat the thorax. No aggressive osseous lesion. CT ABDOMEN AND PELVIS FINDINGS Hepatobiliary: No focal hepatic lesion. No biliary ductal dilatation. Gallbladder is normal. Common bile duct is normal. Pancreas: Pancreas is normal. No ductal dilatation. No pancreatic inflammation. Spleen: Normal spleen Adrenals/urinary tract: Adrenal glands and kidneys are normal. The ureters and bladder normal. Stomach/Bowel: Moderate hiatal hernia noted. Distal esophageal stricture. Examination of the peritoneal space difficult due to very little body  fat high-density barium from 1 day prior. No gross abnormality of the small bowel or colon. Vascular/Lymphatic: Abdominal aorta is normal caliber with atherosclerotic calcification. There is no retroperitoneal or periportal lymphadenopathy. No pelvic lymphadenopathy. Reproductive: Prostate is large Other: No free fluid. Musculoskeletal: No aggressive osseous lesion. IMPRESSION: Chest Impression: 1. Circumferential stricturing of the distal esophagus with high-grade obstruction. Differential would include esophageal neoplasm versus ACHALASIA. 2. Fine airspace disease in the LEFT lower lobe with endobronchial filling defects. Concern for aspiration pneumonitis Abdomen / Pelvis Impression: 1. Very little body fat within the peritoneal space or subcutaneous tissue. 2. Evaluation of the peritoneal contents difficulty high-grade high-density barium. 3. No evidence of metastatic disease within the liver. No gross evidence of lymphadenopathy. Electronically Signed   By: Suzy Bouchard M.D.   On: 11/01/2016 14:45   Ct Abdomen Pelvis W Contrast  Result Date: 11/01/2016 CLINICAL DATA:  Distal esophageal stricture on esophagram. Weight loss EXAM: CT CHEST, ABDOMEN, AND PELVIS WITH CONTRAST TECHNIQUE: Multidetector CT imaging of the chest, abdomen and pelvis was performed following the standard protocol during bolus administration of intravenous contrast. CONTRAST:  67mL ISOVUE-300 IOPAMIDOL (ISOVUE-300) INJECTION 61%, 1 ISOVUE-300 IOPAMIDOL (ISOVUE-300) INJECTION 61% COMPARISON:  Esophagram 11/01/2016 FINDINGS: CT CHEST FINDINGS Cardiovascular: No significant vascular findings. Normal heart size. No pericardial effusion. Mediastinum/Nodes: No axillary or supraclavicular adenopathy. The distal esophagus is patulous and distended to 5.7 cm. There is some retained barium from esophagram 1 day prior. There is circumferential narrowing of esophagus over a 3 cm segment leading up to the GE junction. There is a hiatal hernia.  Lungs/Pleura: There is mild ground-glass airspace opacities in LEFT lower lobe. There branching pattern. There is a material within the LEFT lower lobe bronchi. Findings suggest aspiration pneumonitis. Similar findings in the Right upper lobe laterally. Musculoskeletal: Very little body fat the thorax. No aggressive osseous lesion. CT ABDOMEN AND PELVIS FINDINGS Hepatobiliary: No focal hepatic lesion. No biliary ductal dilatation. Gallbladder is normal. Common bile duct is normal. Pancreas: Pancreas is normal. No ductal dilatation. No pancreatic inflammation. Spleen: Normal spleen Adrenals/urinary tract: Adrenal glands and kidneys are normal. The ureters and bladder normal. Stomach/Bowel: Moderate hiatal hernia noted. Distal esophageal stricture. Examination of the peritoneal space difficult due to very little body fat high-density barium from 1 day prior. No gross abnormality of the small bowel or colon. Vascular/Lymphatic: Abdominal aorta is normal caliber with atherosclerotic calcification. There is no retroperitoneal or periportal lymphadenopathy. No pelvic lymphadenopathy. Reproductive: Prostate is large Other: No free fluid. Musculoskeletal: No aggressive osseous lesion. IMPRESSION: Chest Impression: 1. Circumferential stricturing of the distal esophagus with high-grade obstruction. Differential would include esophageal neoplasm versus ACHALASIA. 2. Fine airspace disease in the LEFT lower lobe with endobronchial filling defects. Concern for aspiration pneumonitis Abdomen / Pelvis Impression: 1. Very little body fat within the peritoneal space or subcutaneous tissue. 2. Evaluation of the peritoneal contents difficulty high-grade high-density barium. 3. No evidence of metastatic disease within the liver.  No gross evidence of lymphadenopathy. Electronically Signed   By: Suzy Bouchard M.D.   On: 11/01/2016 14:45   Dg Chest Port 1 View  Result Date: 11/03/2016 CLINICAL DATA:  PICC line EXAM: PORTABLE CHEST 1  VIEW COMPARISON:  11/01/2016, 07/18/2016 FINDINGS: Hyperinflation is present. No acute infiltrate or effusion. Normal heart size. Right-sided central venous catheter tip overlies the SVC. No pneumothorax. IMPRESSION: Right upper extremity catheter tip overlies the SVC. Hyperinflation without focal infiltrate Electronically Signed   By: Donavan Foil M.D.   On: 11/03/2016 20:04   Dg Ugi W/high Density W/kub  Result Date: 11/01/2016 CLINICAL DATA:  Significant weight loss over the last 3 months greater than 50 pounds. Unable to swallow solids or liquids. EXAM: UPPER GI SERIES WITH KUB TECHNIQUE: After obtaining a scout radiograph a routine upper GI series was performed using thin barium. FLUOROSCOPY TIME:  Fluoroscopy Time:  42 seconds Radiation Exposure Index (if provided by the fluoroscopic device): 3.2 mGy Number of Acquired Spot Images: 0 COMPARISON:  None. FINDINGS: KUB: There is no bowel dilatation to suggest obstruction. There is no evidence of pneumoperitoneum, portal venous gas or pneumatosis. There are no pathologic calcifications along the expected course of the ureters. The osseous structures are unremarkable. Upper GI: The patient is unable to tolerate significant amounts of contrast. During the entire examination the patient was nauseous and continued to spit up saliva as well as any ingested barium. There is a high-grade stricture of the distal esophagus measuring 2.5 cm approximately in length. A small amount of contrast traverses the stricture. The esophagus proximal to the stricture is severely dilated with secretions. IMPRESSION: 1. High-grade stricture of the distal esophagus measuring approximately 2.5 cm in length. Only a trickle of contrast traverses the stricture. The stricture is concerning for an inflammatory or neoplastic etiology. Recommend further evaluation with a CT of the chest and abdomen given the recently failed upper endoscopy. Electronically Signed   By: Kathreen Devoid   On:  11/01/2016 12:02    ASSESSMENT: Stage IVB esophageal cancer, lower third  PLAN:    1. Stage IVB esophageal cancer, lower third: Patient's imaging and documentation from [facility] revealed widespread metastatic disease to his bones. After lengthy discussion with the patient, he has agreed to continue to palliative XRT but refuses concurrent chemotherapy or consideration of systemic chemotherapy. He did agree to return to clinic in 1 week for further discussion of treatment. 2. Malnutrition: Secondary to esophageal cancer. Patient has refused PEG tube on multiple occasions.  3. Schizophrenia: Case previously discussed with patient's power of attorney.   Approximately 30 minutes was spent in discussion of which greater than 50% was consultation.  Patient expressed understanding and was in agreement with this plan. He also understands that He can call clinic at any time with any questions, concerns, or complaints.   Cancer Staging Esophageal cancer [facility]) Staging form: Esophagus - Adenocarcinoma, AJCC 8th Edition - Clinical stage from 11/30/2016: Stage IVB (cTX, cNX, cM1) - Signed by Lloyd Huger, MD on 11/30/2016   Lloyd Huger, MD   11/30/2016 10:50 AM

## 2016-11-27 ENCOUNTER — Inpatient Hospital Stay: Attending: Oncology | Admitting: Oncology

## 2016-11-27 ENCOUNTER — Ambulatory Visit
Admission: RE | Admit: 2016-11-27 | Discharge: 2016-11-27 | Disposition: A | Discharge status: Home / Self Care | Payer: Medicare Other | Source: Ambulatory Visit | Attending: Radiation Oncology | Admitting: Radiation Oncology

## 2016-11-27 DIAGNOSIS — F200 Paranoid schizophrenia: Secondary | ICD-10-CM | POA: Insufficient documentation

## 2016-11-27 DIAGNOSIS — R112 Nausea with vomiting, unspecified: Secondary | ICD-10-CM | POA: Insufficient documentation

## 2016-11-27 DIAGNOSIS — E46 Unspecified protein-calorie malnutrition: Secondary | ICD-10-CM | POA: Diagnosis not present

## 2016-11-27 DIAGNOSIS — C155 Malignant neoplasm of lower third of esophagus: Secondary | ICD-10-CM | POA: Diagnosis not present

## 2016-11-27 DIAGNOSIS — R531 Weakness: Secondary | ICD-10-CM | POA: Diagnosis not present

## 2016-11-27 DIAGNOSIS — E7800 Pure hypercholesterolemia, unspecified: Secondary | ICD-10-CM | POA: Insufficient documentation

## 2016-11-27 DIAGNOSIS — J449 Chronic obstructive pulmonary disease, unspecified: Secondary | ICD-10-CM | POA: Insufficient documentation

## 2016-11-27 DIAGNOSIS — F17210 Nicotine dependence, cigarettes, uncomplicated: Secondary | ICD-10-CM | POA: Insufficient documentation

## 2016-11-27 DIAGNOSIS — R634 Abnormal weight loss: Secondary | ICD-10-CM | POA: Diagnosis not present

## 2016-11-27 DIAGNOSIS — R5383 Other fatigue: Secondary | ICD-10-CM | POA: Insufficient documentation

## 2016-11-27 DIAGNOSIS — C7951 Secondary malignant neoplasm of bone: Secondary | ICD-10-CM | POA: Diagnosis not present

## 2016-11-27 DIAGNOSIS — Z79899 Other long term (current) drug therapy: Secondary | ICD-10-CM | POA: Diagnosis not present

## 2016-11-27 DIAGNOSIS — Z510 Encounter for antineoplastic radiation therapy: Secondary | ICD-10-CM | POA: Diagnosis not present

## 2016-11-27 DIAGNOSIS — Z7189 Other specified counseling: Secondary | ICD-10-CM | POA: Insufficient documentation

## 2016-11-27 DIAGNOSIS — C771 Secondary and unspecified malignant neoplasm of intrathoracic lymph nodes: Secondary | ICD-10-CM | POA: Diagnosis not present

## 2016-11-27 DIAGNOSIS — F209 Schizophrenia, unspecified: Secondary | ICD-10-CM | POA: Diagnosis not present

## 2016-11-27 LAB — SURGICAL PATHOLOGY

## 2016-11-27 NOTE — Progress Notes (Signed)
START OFF PATHWAY REGIMEN - Gastroesophageal  Off Pathway: Carboplatin AUC 2 + Paclitaxel 50 mg/m2 weekly + RT  [id number]:Carboplatin AUC 2 + Paclitaxel 50 mg/m2 weekly + RT:   Administer weekly during RT:     Paclitaxel (Taxol(R)) 50 mg/m2 in 250 mL NS IV over 60 minutes weekly with concurrent RT Dose Mod: None     Carboplatin (Paraplatin(R)) AUC=2 in 100 mL NS IV over 30 minutes weekly with concurrent RT Dose Mod: None Additional Orders: Given with concurrent chemoradiotherapy. GCSF therapy with RT is a relative contraindication.  **Always confirm dose/schedule in your pharmacy ordering system**    Patient Characteristics: Distant Metastases (cM1/pM1) / Locally Recurrent Disease, Adenocarcinoma - Esophageal, GE Junction, and Gastric, First Line, HER2 Negative / Unknown Histology: Adenocarcinoma Disease Classification: Esophageal Therapeutic Status: Distant Metastases (No Additional Staging) Line of therapy: First Line Would you be surprised if this patient died  in the next year? I would NOT be surprised if this patient died in the next year HER2 Status: Did Not Order Test  Intent of Therapy: Non-Curative / Palliative Intent, Discussed with Patient

## 2016-11-27 NOTE — Progress Notes (Signed)
Patient is here for follow up, he is not eating due to the esophogeal cancer. He does not have bms due to not eating.

## 2016-11-30 ENCOUNTER — Other Ambulatory Visit: Payer: Self-pay | Admitting: *Deleted

## 2016-11-30 DIAGNOSIS — C155 Malignant neoplasm of lower third of esophagus: Secondary | ICD-10-CM

## 2016-12-03 ENCOUNTER — Ambulatory Visit
Admission: RE | Admit: 2016-12-03 | Discharge: 2016-12-03 | Disposition: A | Discharge status: Home / Self Care | Payer: Medicare Other | Source: Ambulatory Visit | Attending: Radiation Oncology | Admitting: Radiation Oncology

## 2016-12-03 ENCOUNTER — Ambulatory Visit
Admission: RE | Admit: 2016-12-03 | Discharge: 2016-12-03 | Disposition: A | Discharge status: Home / Self Care | Payer: Self-pay | Source: Ambulatory Visit | Attending: Radiation Oncology | Admitting: Radiation Oncology

## 2016-12-03 ENCOUNTER — Other Ambulatory Visit: Payer: Self-pay | Admitting: Radiation Oncology

## 2016-12-03 DIAGNOSIS — T18108A Unspecified foreign body in esophagus causing other injury, initial encounter: Secondary | ICD-10-CM

## 2016-12-03 DIAGNOSIS — C155 Malignant neoplasm of lower third of esophagus: Secondary | ICD-10-CM | POA: Diagnosis not present

## 2016-12-03 NOTE — Progress Notes (Signed)
[place]  Telephone:(336) [phone] Fax:(336) [phone]  ID: Roberto Davis OB: 15-Nov-1960  MR#: [id number]  [id number]  Patient Care Team: No Pcp Per Patient as PCP - General (General Practice) Lucilla Lame, MD as Consulting Physician (Gastroenterology)  CHIEF COMPLAINT: Stage IVB esophageal cancer, lower third.  INTERVAL HISTORY: Patient returns to clinic for further evaluation and discussion of initiating chemotherapy. He is tolerating XRT well. Patient's history is difficult given his underlying schizophrenia. He has no neurologic complaints. He denies any recent fevers. He denies any chest pain or shortness of breath. He has significant weight loss, but states his PO intake has improved. He denies any constipation or diarrhea. He does not complain of pain currently. He has no urinary complaints. Patient offers no further specific complaints.  REVIEW OF SYSTEMS:   Review of Systems  Constitutional: Positive for malaise/fatigue and weight loss. Negative for fever.  Respiratory: Negative.  Negative for cough and shortness of breath.   Cardiovascular: Negative.  Negative for chest pain and leg swelling.  Gastrointestinal: Positive for nausea and vomiting. Negative for abdominal pain.  Genitourinary: Negative.   Musculoskeletal: Negative.   Neurological: Positive for weakness.  Psychiatric/Behavioral: Negative.     As per HPI. Otherwise, a complete review of systems is negative.  PAST MEDICAL HISTORY: Past Medical History:  Diagnosis Date  . COPD (chronic obstructive pulmonary disease) ([place])   . High cholesterol   . Paranoid schizophrenia ([place])     PAST SURGICAL HISTORY: Past Surgical History:  Procedure Laterality Date  . DG BARIUM SWALLOW ([place] HX)    . ESOPHAGOGASTRODUODENOSCOPY (EGD) WITH PROPOFOL N/A 10/25/2016   Procedure: ESOPHAGOGASTRODUODENOSCOPY (EGD) WITH PROPOFOL;  Surgeon: Jonathon Bellows, MD;  Location: [facility] ENDOSCOPY;  Service: Endoscopy;   Laterality: N/A;  . ESOPHAGOGASTRODUODENOSCOPY (EGD) WITH PROPOFOL N/A 11/02/2016   Procedure: ESOPHAGOGASTRODUODENOSCOPY (EGD) WITH PROPOFOL;  Surgeon: Lucilla Lame, MD;  Location: [facility] ENDOSCOPY;  Service: Endoscopy;  Laterality: N/A;  . HAND SURGERY      FAMILY HISTORY: Family History  Problem Relation Age of Onset  . Heart failure Mother     ADVANCED DIRECTIVES (Y/N):  N  HEALTH MAINTENANCE: Social History  Substance Use Topics  . Smoking status: Current Every Day Smoker    Packs/day: 0.25    Years: 40.00    Types: Cigarettes  . Smokeless tobacco: Never Used  . Alcohol use No     Colonoscopy:  PAP:  Bone density:  Lipid panel:  Allergies  Allergen Reactions  . Haldol [Haloperidol Lactate] Other (See Comments)    Other reaction(s): Other (See Comments) Shaking uncontrollable  Shaking uncontrollable   . Ampicillin   . Depakote [Divalproex Sodium]   . Diphenhydramine Hcl   . Lamictal [Lamotrigine]   . Penicillins Other (See Comments)    Reaction unknown-childhood allergy    Current Outpatient Prescriptions  Medication Sig Dispense Refill  . benztropine (COGENTIN) 1 MG tablet Take 1 mg by mouth daily.     . Cholecalciferol (VITAMIN D3) 5000 units CAPS Take 5,000 Units by mouth daily.    . fat emulsion 20 % infusion Inject 240 mLs into the vein continuous. (Patient not taking: Reported on 11/21/2016) 250 mL 0  . LORazepam (ATIVAN) 2 MG tablet Take 2 mg by mouth at bedtime. And prn    . OLANZapine zydis (ZYPREXA) 10 MG disintegrating tablet Take 10 mg by mouth at bedtime.    . ondansetron (ZOFRAN) 4 MG tablet Take 4 mg by mouth as needed for nausea or vomiting.  q 4-6 h    . pantoprazole (PROTONIX) 40 MG tablet Take 40 mg by mouth 2 (two) times daily.     . sucralfate (CARAFATE) 1 g tablet Take 1 g by mouth 4 (four) times daily.    . traMADol (ULTRAM) 50 MG tablet Take 1 tablet (50 mg total) by mouth every 6 (six) hours as needed. 15 tablet 0  . triamcinolone cream  (KENALOG) 0.1 % Apply 1 application topically 3 (three) times daily as needed (for healing).    . VENTOLIN HFA 108 (90 BASE) MCG/ACT inhaler Inhale 1 puff into the lungs every 6 (six) hours as needed.      No current facility-administered medications for this visit.     OBJECTIVE: Vitals:   12/04/16 1142  BP: 103/75  Pulse: 89  Resp: 18  Temp: 97.5 F (36.4 C)     Body mass index is 12.99 kg/m.    ECOG FS:1 - Symptomatic but completely ambulatory  General: Well-developed, well-nourished, no acute distress. Eyes: Pink conjunctiva, anicteric sclera. Lungs: Clear to auscultation bilaterally. Heart: Regular rate and rhythm. No rubs, murmurs, or gallops. Abdomen: Soft, nontender, nondistended. No organomegaly noted, normoactive bowel sounds. Musculoskeletal: No edema, cyanosis, or clubbing. Neuro: Alert, answering all questions appropriately. Cranial nerves grossly intact. Skin: No rashes or petechiae noted. Psych: Normal affect.   LAB RESULTS:  Lab Results  Component Value Date   NA 134 (L) 11/07/2016   K 3.8 11/07/2016   CL 102 11/07/2016   CO2 27 11/07/2016   GLUCOSE 78 11/07/2016   BUN 19 11/07/2016   CREATININE 0.56 (L) 11/07/2016   CALCIUM 8.7 (L) 11/07/2016   PROT 5.4 (L) 11/03/2016   ALBUMIN 3.0 (L) 11/03/2016   AST 16 11/03/2016   ALT 13 (L) 11/03/2016   ALKPHOS 53 11/03/2016   BILITOT 0.9 11/03/2016   GFRNONAA >60 11/07/2016   GFRAA >60 11/07/2016    Lab Results  Component Value Date   WBC 9.9 11/02/2016   NEUTROABS 8.5 (H) 11/01/2016   HGB 13.0 11/02/2016   HCT 37.6 (L) 11/02/2016   MCV 88.8 11/02/2016   PLT 279 11/02/2016     STUDIES: No results found.  ASSESSMENT: Stage IVB esophageal cancer, lower third  PLAN:    1. Stage IVB esophageal cancer, lower third: Patient's imaging and documentation from [facility] revealed widespread metastatic disease to his bones. After lengthy discussion with the patient, he has agreed to continue to palliative XRT  but continues to refuse concurrent chemotherapy or consideration of systemic chemotherapy. He did agree to a dietary consult. Return to clinic in 1 month for further evaluation. 2. Malnutrition: Secondary to esophageal cancer. Patient has refused PEG tube on multiple occasions. Diety consult as above 3. Schizophrenia: Case previously discussed with patient's power of attorney.   Approximately 20 minutes was spent in discussion of which greater than 50% was consultation.  Patient expressed understanding and was in agreement with this plan. He also understands that He can call clinic at any time with any questions, concerns, or complaints.   Cancer Staging Esophageal cancer [facility]) Staging form: Esophagus - Adenocarcinoma, AJCC 8th Edition - Clinical stage from 11/30/2016: Stage IVB (cTX, cNX, cM1) - Signed by Lloyd Huger, MD on 11/30/2016   Lloyd Huger, MD   12/08/2016 10:45 AM

## 2016-12-04 ENCOUNTER — Ambulatory Visit
Admission: RE | Admit: 2016-12-04 | Discharge: 2016-12-04 | Disposition: A | Discharge status: Home / Self Care | Payer: Medicare Other | Source: Ambulatory Visit | Attending: Radiation Oncology | Admitting: Radiation Oncology

## 2016-12-04 ENCOUNTER — Inpatient Hospital Stay (HOSPITAL_BASED_OUTPATIENT_CLINIC_OR_DEPARTMENT_OTHER): Admitting: Oncology

## 2016-12-04 VITALS — BP 103/75 | HR 89 | Temp 97.5°F | Resp 18 | Wt 90.5 lb

## 2016-12-04 DIAGNOSIS — R5383 Other fatigue: Secondary | ICD-10-CM | POA: Diagnosis not present

## 2016-12-04 DIAGNOSIS — E7800 Pure hypercholesterolemia, unspecified: Secondary | ICD-10-CM

## 2016-12-04 DIAGNOSIS — J449 Chronic obstructive pulmonary disease, unspecified: Secondary | ICD-10-CM | POA: Diagnosis not present

## 2016-12-04 DIAGNOSIS — Z79899 Other long term (current) drug therapy: Secondary | ICD-10-CM

## 2016-12-04 DIAGNOSIS — R112 Nausea with vomiting, unspecified: Secondary | ICD-10-CM | POA: Diagnosis not present

## 2016-12-04 DIAGNOSIS — C155 Malignant neoplasm of lower third of esophagus: Secondary | ICD-10-CM

## 2016-12-04 DIAGNOSIS — R634 Abnormal weight loss: Secondary | ICD-10-CM

## 2016-12-04 DIAGNOSIS — F17210 Nicotine dependence, cigarettes, uncomplicated: Secondary | ICD-10-CM | POA: Diagnosis not present

## 2016-12-04 DIAGNOSIS — E46 Unspecified protein-calorie malnutrition: Secondary | ICD-10-CM

## 2016-12-04 DIAGNOSIS — F200 Paranoid schizophrenia: Secondary | ICD-10-CM

## 2016-12-04 DIAGNOSIS — R531 Weakness: Secondary | ICD-10-CM

## 2016-12-04 NOTE — Progress Notes (Signed)
Complains of back pain that is relieved by taking pain medication. Med list was not brought in today so meds could not be reconciled. Able to eat soft foods and swallow liquids without difficulty. Tried to eat chicken yesterday but could not keep down.

## 2016-12-05 ENCOUNTER — Ambulatory Visit
Admission: RE | Admit: 2016-12-05 | Discharge: 2016-12-05 | Disposition: A | Discharge status: Home / Self Care | Payer: Medicare Other | Source: Ambulatory Visit | Attending: Radiation Oncology | Admitting: Radiation Oncology

## 2016-12-05 DIAGNOSIS — C155 Malignant neoplasm of lower third of esophagus: Secondary | ICD-10-CM | POA: Diagnosis not present

## 2016-12-06 ENCOUNTER — Ambulatory Visit
Admission: RE | Admit: 2016-12-06 | Discharge: 2016-12-06 | Disposition: A | Discharge status: Home / Self Care | Payer: Medicare Other | Source: Ambulatory Visit | Attending: Radiation Oncology | Admitting: Radiation Oncology

## 2016-12-06 ENCOUNTER — Inpatient Hospital Stay

## 2016-12-06 DIAGNOSIS — C155 Malignant neoplasm of lower third of esophagus: Secondary | ICD-10-CM | POA: Diagnosis not present

## 2016-12-06 NOTE — Progress Notes (Signed)
Nutrition  Patient was no show for nutrition appointment today.  Reniyah Gootee B. Coutney Wildermuth, RD, LDN Registered Dietitian [phone] (pager)   

## 2016-12-07 ENCOUNTER — Ambulatory Visit
Admission: RE | Admit: 2016-12-07 | Discharge: 2016-12-07 | Disposition: A | Discharge status: Home / Self Care | Payer: Medicare Other | Source: Ambulatory Visit | Attending: Radiation Oncology | Admitting: Radiation Oncology

## 2016-12-07 DIAGNOSIS — C155 Malignant neoplasm of lower third of esophagus: Secondary | ICD-10-CM | POA: Diagnosis not present

## 2016-12-10 ENCOUNTER — Ambulatory Visit
Admission: RE | Admit: 2016-12-10 | Discharge: 2016-12-10 | Disposition: A | Discharge status: Home / Self Care | Payer: Medicare Other | Source: Ambulatory Visit | Attending: Radiation Oncology | Admitting: Radiation Oncology

## 2016-12-10 DIAGNOSIS — C155 Malignant neoplasm of lower third of esophagus: Secondary | ICD-10-CM | POA: Diagnosis not present

## 2016-12-11 ENCOUNTER — Ambulatory Visit: Payer: Medicare Other

## 2016-12-11 ENCOUNTER — Inpatient Hospital Stay

## 2016-12-12 ENCOUNTER — Ambulatory Visit: Admission: RE | Admit: 2016-12-12 | Payer: Medicare Other | Source: Ambulatory Visit

## 2016-12-12 ENCOUNTER — Ambulatory Visit: Payer: Medicare Other

## 2016-12-13 ENCOUNTER — Ambulatory Visit: Payer: Medicare Other

## 2016-12-14 ENCOUNTER — Ambulatory Visit: Payer: Medicare Other

## 2016-12-17 ENCOUNTER — Ambulatory Visit: Payer: Medicare Other

## 2016-12-18 ENCOUNTER — Ambulatory Visit: Payer: Medicare Other

## 2016-12-19 ENCOUNTER — Ambulatory Visit: Payer: Medicare Other

## 2016-12-20 ENCOUNTER — Ambulatory Visit: Payer: Medicare Other

## 2016-12-21 ENCOUNTER — Ambulatory Visit: Payer: Medicare Other

## 2017-01-06 NOTE — Progress Notes (Deleted)
[facility]  [phone] [phone]  ID: Roberto Davis OB: 1961/03/24  MR#: [id number]  TKP#:[id number]  Patient Care Team: No Pcp Per Patient as PCP - General ([place]) Lucilla Lame, MD as Consulting Physician (Gastroenterology)  CHIEF COMPLAINT: Stage IVB esophageal cancer, lower third.  INTERVAL HISTORY: Patient returns to clinic for further evaluation and discussion of initiating chemotherapy. He is tolerating XRT well. Patient's history is difficult given his underlying schizophrenia. He has no neurologic complaints. He denies any recent fevers. He denies any chest pain or shortness of breath. He has significant weight loss, but states his PO intake has improved. He denies any constipation or diarrhea. He does not complain of pain currently. He has no urinary complaints. Patient offers no further specific complaints.  REVIEW OF SYSTEMS:   Review of Systems  Constitutional: Positive for malaise/fatigue and weight loss. Negative for fever.  Respiratory: Negative.  Negative for cough and shortness of breath.   Cardiovascular: Negative.  Negative for chest pain and leg swelling.  Gastrointestinal: Positive for nausea and vomiting. Negative for abdominal pain.  Genitourinary: Negative.   Musculoskeletal: Negative.   Neurological: Positive for weakness.  Psychiatric/Behavioral: Negative.     As per HPI. Otherwise, a complete review of systems is negative.  PAST MEDICAL HISTORY: Past Medical History:  Diagnosis Date  . COPD (chronic obstructive pulmonary disease) ([place])   . High cholesterol   . Paranoid schizophrenia ([place])     PAST SURGICAL HISTORY: Past Surgical History:  Procedure Laterality Date  . DG BARIUM SWALLOW ([place] HX)    . ESOPHAGOGASTRODUODENOSCOPY (EGD) WITH PROPOFOL N/A 10/25/2016   Procedure: ESOPHAGOGASTRODUODENOSCOPY (EGD) WITH PROPOFOL;  Surgeon: Jonathon Bellows, MD;  Location: [facility] ENDOSCOPY;  Service: Endoscopy;   Laterality: N/A;  . ESOPHAGOGASTRODUODENOSCOPY (EGD) WITH PROPOFOL N/A 11/02/2016   Procedure: ESOPHAGOGASTRODUODENOSCOPY (EGD) WITH PROPOFOL;  Surgeon: Lucilla Lame, MD;  Location: [facility] ENDOSCOPY;  Service: Endoscopy;  Laterality: N/A;  . HAND SURGERY      FAMILY HISTORY: Family History  Problem Relation Age of Onset  . Heart failure Mother     ADVANCED DIRECTIVES (Y/N):  N  HEALTH MAINTENANCE: Social History  Substance Use Topics  . Smoking status: Current Every Day Smoker    Packs/day: 0.25    Years: 40.00    Types: Cigarettes  . Smokeless tobacco: Never Used  . Alcohol use No     Colonoscopy:  PAP:  Bone density:  Lipid panel:  Allergies  Allergen Reactions  . Haldol [Haloperidol Lactate] Other (See Comments)    Other reaction(s): Other (See Comments) Shaking uncontrollable  Shaking uncontrollable   . Ampicillin   . Depakote [Divalproex Sodium]   . Diphenhydramine Hcl   . Lamictal [Lamotrigine]   . Penicillins Other (See Comments)    Reaction unknown-childhood allergy    Current Outpatient Prescriptions  Medication Sig Dispense Refill  . benztropine (COGENTIN) 1 MG tablet Take 1 mg by mouth daily.     . Cholecalciferol (VITAMIN D3) 5000 units CAPS Take 5,000 Units by mouth daily.    . fat emulsion 20 % infusion Inject 240 mLs into the vein continuous. (Patient not taking: Reported on 11/21/2016) 250 mL 0  . LORazepam (ATIVAN) 2 MG tablet Take 2 mg by mouth at bedtime. And prn    . OLANZapine zydis (ZYPREXA) 10 MG disintegrating tablet Take 10 mg by mouth at bedtime.    . ondansetron (ZOFRAN) 4 MG tablet Take 4 mg by mouth as needed for nausea or vomiting.  q 4-6 h    . pantoprazole (PROTONIX) 40 MG tablet Take 40 mg by mouth 2 (two) times daily.     . sucralfate (CARAFATE) 1 g tablet Take 1 g by mouth 4 (four) times daily.    . traMADol (ULTRAM) 50 MG tablet Take 1 tablet (50 mg total) by mouth every 6 (six) hours as needed. 15 tablet 0  . triamcinolone cream  (KENALOG) 0.1 % Apply 1 application topically 3 (three) times daily as needed (for healing).    . VENTOLIN HFA 108 (90 BASE) MCG/ACT inhaler Inhale 1 puff into the lungs every 6 (six) hours as needed.      No current facility-administered medications for this visit.     OBJECTIVE: There were no vitals filed for this visit.   There is no height or weight on file to calculate BMI.    ECOG FS:1 - Symptomatic but completely ambulatory  General: Well-developed, well-nourished, no acute distress. Eyes: Pink conjunctiva, anicteric sclera. Lungs: Clear to auscultation bilaterally. Heart: Regular rate and rhythm. No rubs, murmurs, or gallops. Abdomen: Soft, nontender, nondistended. No organomegaly noted, normoactive bowel sounds. Musculoskeletal: No edema, cyanosis, or clubbing. Neuro: Alert, answering all questions appropriately. Cranial nerves grossly intact. Skin: No rashes or petechiae noted. Psych: Normal affect.   LAB RESULTS:  Lab Results  Component Value Date   NA 134 (L) 11/07/2016   K 3.8 11/07/2016   CL 102 11/07/2016   CO2 27 11/07/2016   GLUCOSE 78 11/07/2016   BUN 19 11/07/2016   CREATININE 0.56 (L) 11/07/2016   CALCIUM 8.7 (L) 11/07/2016   PROT 5.4 (L) 11/03/2016   ALBUMIN 3.0 (L) 11/03/2016   AST 16 11/03/2016   ALT 13 (L) 11/03/2016   ALKPHOS 53 11/03/2016   BILITOT 0.9 11/03/2016   GFRNONAA >60 11/07/2016   GFRAA >60 11/07/2016    Lab Results  Component Value Date   WBC 9.9 11/02/2016   NEUTROABS 8.5 (H) 11/01/2016   HGB 13.0 11/02/2016   HCT 37.6 (L) 11/02/2016   MCV 88.8 11/02/2016   PLT 279 11/02/2016     STUDIES: No results found.  ASSESSMENT: Stage IVB esophageal cancer, lower third  PLAN:    1. Stage IVB esophageal cancer, lower third: Patient's imaging and documentation from [facility] revealed widespread metastatic disease to his bones. After lengthy discussion with the patient, he has agreed to continue to palliative XRT but continues to refuse  concurrent chemotherapy or consideration of systemic chemotherapy. He did agree to a dietary consult. Return to clinic in 1 month for further evaluation. 2. Malnutrition: Secondary to esophageal cancer. Patient has refused PEG tube on multiple occasions. Diety consult as above 3. Schizophrenia: Case previously discussed with patient's power of attorney.   Approximately 20 minutes was spent in discussion of which greater than 50% was consultation.  Patient expressed understanding and was in agreement with this plan. He also understands that He can call clinic at any time with any questions, concerns, or complaints.   Cancer Staging Esophageal cancer [facility]) Staging form: Esophagus - Adenocarcinoma, AJCC 8th Edition - Clinical stage from 11/30/2016: Stage IVB (cTX, cNX, cM1) - Signed by Lloyd Huger, MD on 11/30/2016   Lloyd Huger, MD   01/06/2017 3:09 PM

## 2017-01-07 ENCOUNTER — Inpatient Hospital Stay: Payer: Medicare Other | Admitting: Oncology

## 2017-01-13 DEATH — deceased

## 2017-01-24 ENCOUNTER — Ambulatory Visit: Payer: Medicare Other | Admitting: Radiation Oncology

## 2017-02-11 ENCOUNTER — Inpatient Hospital Stay: Payer: Self-pay | Admitting: Oncology

## 2018-12-04 IMAGING — CT CT CHEST W/ CM
2 of 5 series · 12 of 36 positions shown, 15 images · IV contrast (iopamidol)
Comparison: Esophagram 11/01/2016

CLINICAL DATA: Distal esophageal stricture on esophagram. Weight
loss

EXAM:
CT CHEST, ABDOMEN, AND PELVIS WITH CONTRAST
TECHNIQUE: Multidetector CT imaging of the chest, abdomen and pelvis was
performed following the standard protocol during bolus
administration of intravenous contrast.
CONTRAST:  75mL A2ZLX9-T66 IOPAMIDOL (A2ZLX9-T66) INJECTION 61%, 1
A2ZLX9-T66 IOPAMIDOL (A2ZLX9-T66) INJECTION 61%

[Series 2: cap with · axial · 0.71mm/px · z∈[-894,-364]mm · 9 of 134 slices shown, 12 images]
[im 14/134  mediastinal]
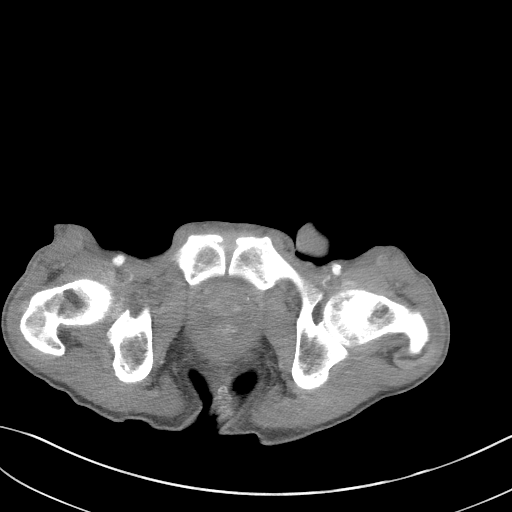
[im 14/134  lung]
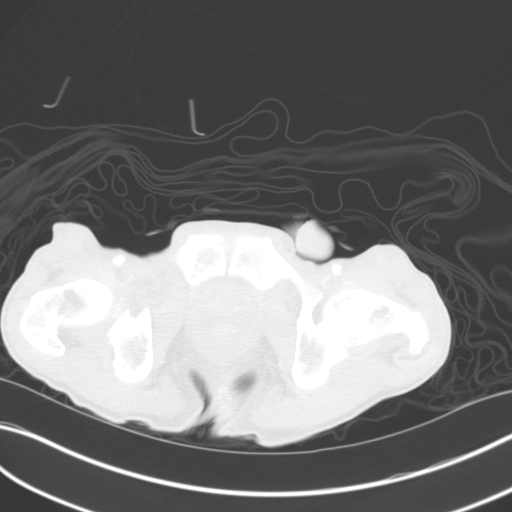
[im 27/134  lung]
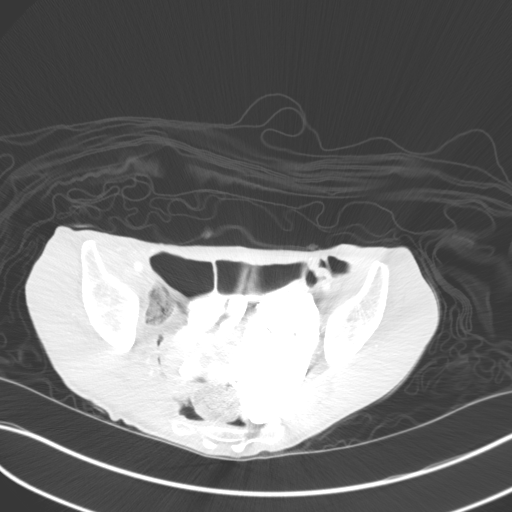
[im 40/134  lung]
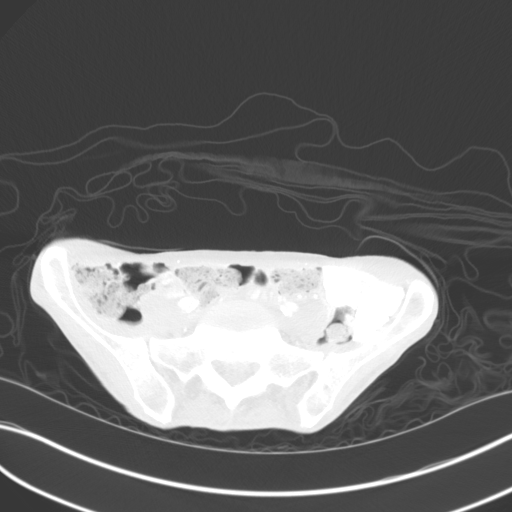
[im 54/134  lung]
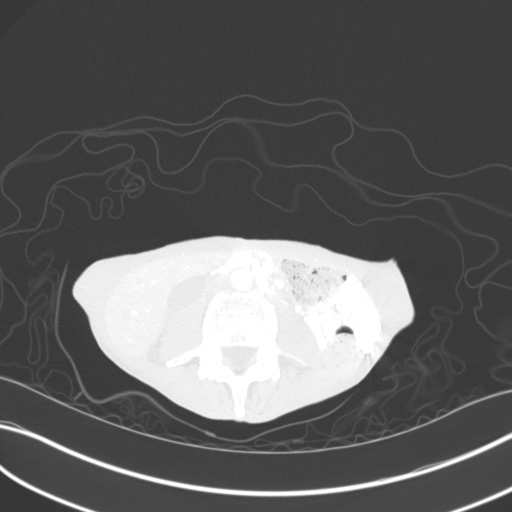
[im 67/134  mediastinal]
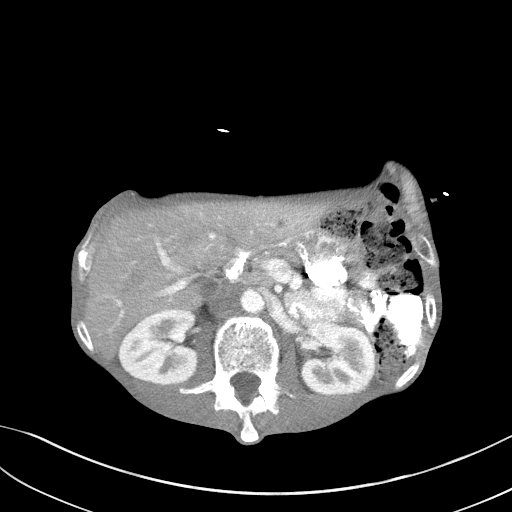
[im 67/134  lung]
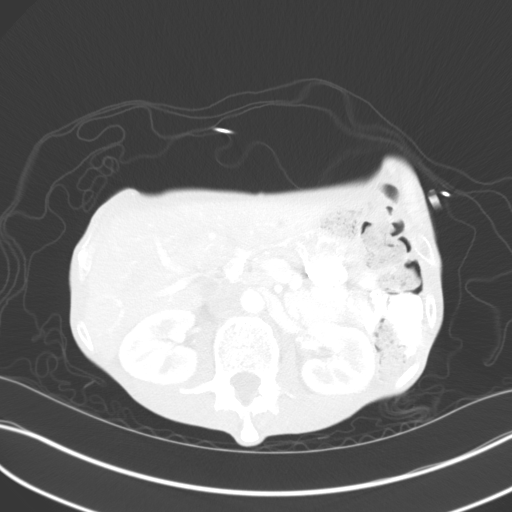
[im 80/134  lung]
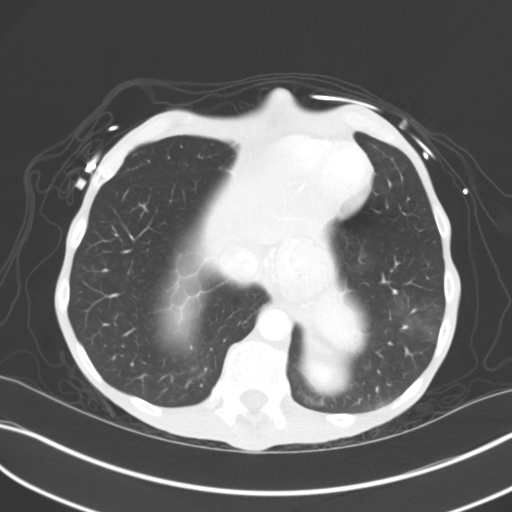
[im 94/134  lung]
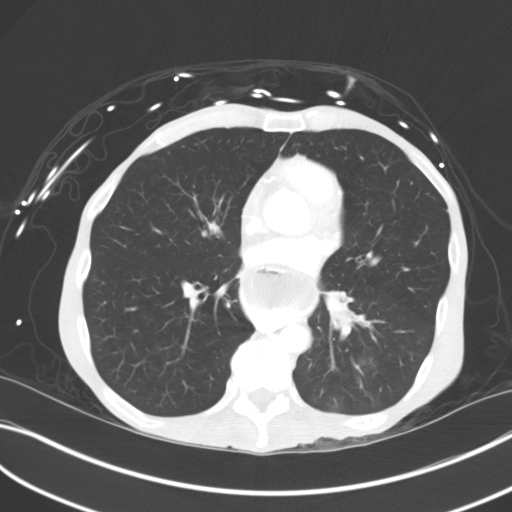
[im 107/134  lung]
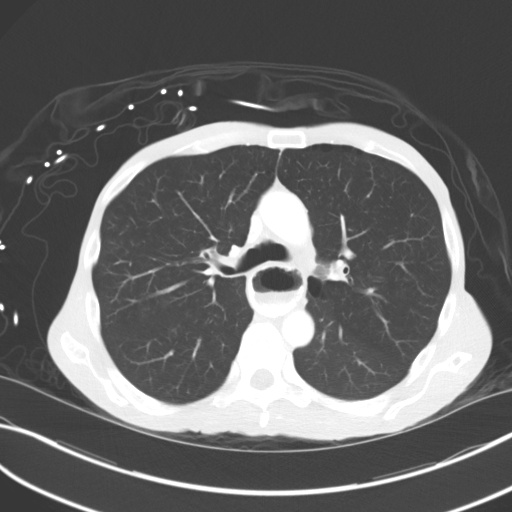
[im 120/134  mediastinal]
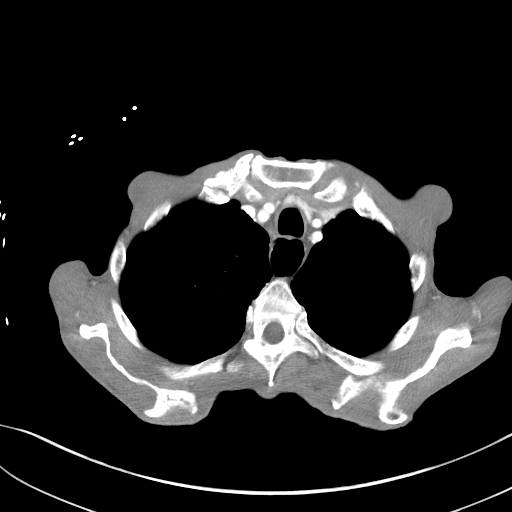
[im 120/134  lung]
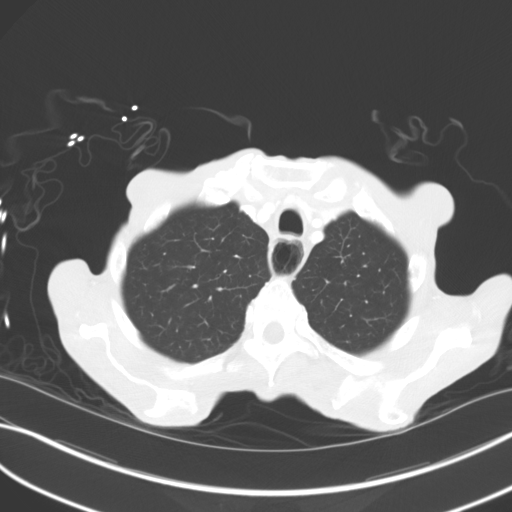

[Series 5: coronals · coronal · 0.73mm/px · 3 of 141 slices shown]
[im 29/141  lung]
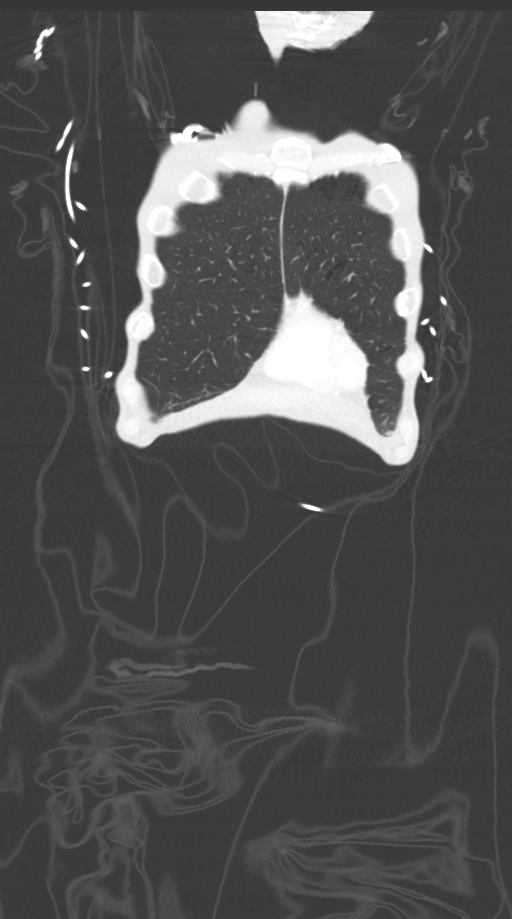
[im 57/141  lung]
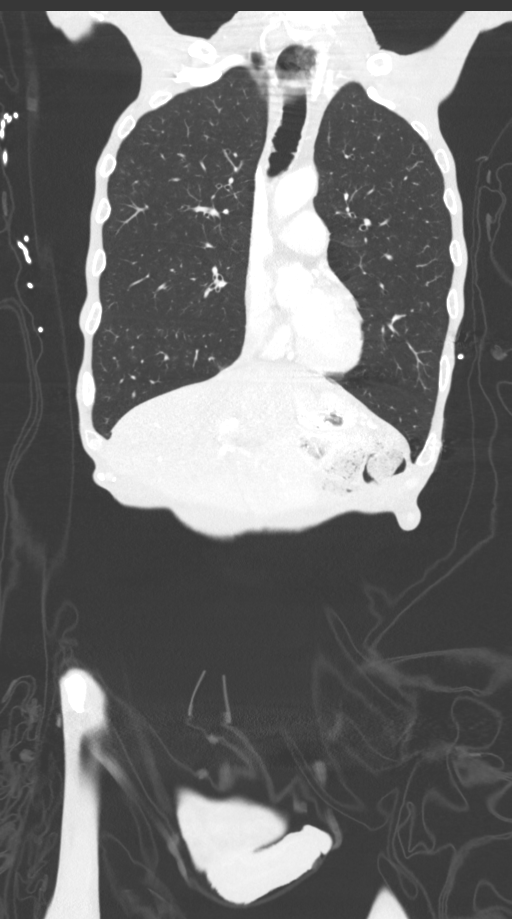
[im 85/141  lung]
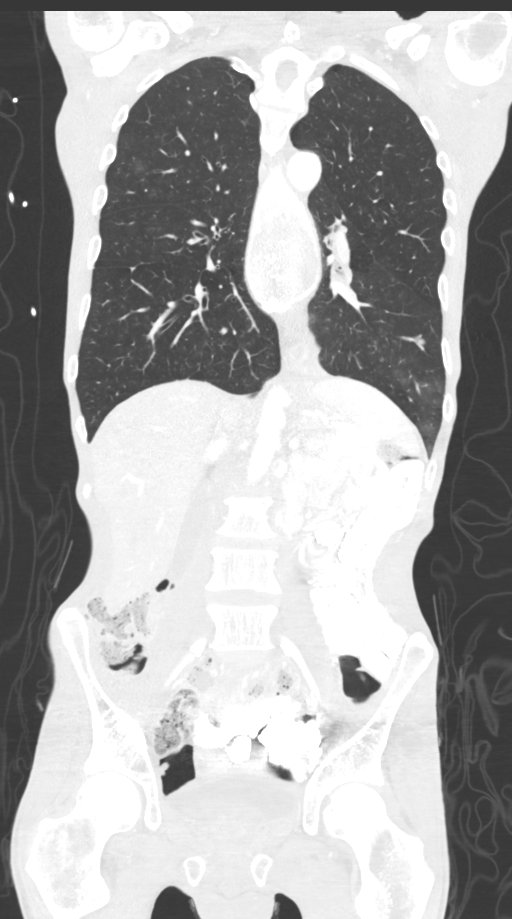

[12 of 36 positions shown; findings below may reference images not displayed]

FINDINGS: CT CHEST FINDINGS

Cardiovascular: No significant vascular findings. Normal heart size.
No pericardial effusion.

Mediastinum/Nodes: No axillary or supraclavicular adenopathy.

The distal esophagus is patulous and distended to 5.7 cm. There is
some retained barium from esophagram 1 day prior. There is
circumferential narrowing of esophagus over a 3 cm segment leading
up to the GE junction. There is a hiatal hernia.

Lungs/Pleura: There is mild ground-glass airspace opacities in LEFT
lower lobe. There branching pattern. There is a material within the
LEFT lower lobe bronchi. Findings suggest aspiration pneumonitis.
Similar findings in the Right upper lobe laterally.

Musculoskeletal: Very little body fat the thorax. No aggressive
osseous lesion.

CT ABDOMEN AND PELVIS FINDINGS

Hepatobiliary: No focal hepatic lesion. No biliary ductal
dilatation. Gallbladder is normal. Common bile duct is normal.

Pancreas: Pancreas is normal. No ductal dilatation. No pancreatic
inflammation.

Spleen: Normal spleen

Adrenals/urinary tract: Adrenal glands and kidneys are normal. The
ureters and bladder normal.

Stomach/Bowel: Moderate hiatal hernia noted. Distal esophageal
stricture. Examination of the peritoneal space difficult due to very
little body fat high-density barium from 1 day prior. No gross
abnormality of the small bowel or colon.

Vascular/Lymphatic: Abdominal aorta is normal caliber with
atherosclerotic calcification. There is no retroperitoneal or
periportal lymphadenopathy. No pelvic lymphadenopathy.

Reproductive: Prostate is large

Other: No free fluid.

Musculoskeletal: No aggressive osseous lesion.
IMPRESSION: Chest Impression:

1. Circumferential stricturing of the distal esophagus with
high-grade obstruction. Differential would include esophageal
neoplasm versus ACHALASIA.
2. Fine airspace disease in the LEFT lower lobe with endobronchial
filling defects. Concern for aspiration pneumonitis

Abdomen / Pelvis Impression:

1. Very little body fat within the peritoneal space or subcutaneous
tissue.
2. Evaluation of the peritoneal contents difficulty high-grade
high-density barium.
3. No evidence of metastatic disease within the liver. No gross
evidence of lymphadenopathy.
